# Patient Record
Sex: Female | Born: 1965 | Race: White | Hispanic: No | Marital: Married | Smoking: Never smoker
Health system: Southern US, Community
[De-identification: ages and names within clinical notes are randomized; demographics above are authoritative.]

## PROBLEM LIST (undated history)

## (undated) DIAGNOSIS — R002 Palpitations: Secondary | ICD-10-CM

## (undated) DIAGNOSIS — M6281 Muscle weakness (generalized): Secondary | ICD-10-CM

## (undated) DIAGNOSIS — R251 Tremor, unspecified: Secondary | ICD-10-CM

## (undated) DIAGNOSIS — M545 Low back pain, unspecified: Secondary | ICD-10-CM

## (undated) DIAGNOSIS — R5383 Other fatigue: Secondary | ICD-10-CM

## (undated) DIAGNOSIS — F419 Anxiety disorder, unspecified: Secondary | ICD-10-CM

## (undated) DIAGNOSIS — G8929 Other chronic pain: Secondary | ICD-10-CM

## (undated) DIAGNOSIS — A6920 Lyme disease, unspecified: Secondary | ICD-10-CM

## (undated) DIAGNOSIS — R229 Localized swelling, mass and lump, unspecified: Principal | ICD-10-CM

## (undated) DIAGNOSIS — K219 Gastro-esophageal reflux disease without esophagitis: Secondary | ICD-10-CM

## (undated) HISTORY — DX: Localized swelling, mass and lump, unspecified: R22.9

## (undated) HISTORY — DX: Anxiety disorder, unspecified: F41.9

## (undated) HISTORY — DX: Other fatigue: R53.83

## (undated) HISTORY — DX: Gastro-esophageal reflux disease without esophagitis: K21.9

## (undated) HISTORY — DX: Tremor, unspecified: R25.1

## (undated) HISTORY — DX: Muscle weakness (generalized): M62.81

## (undated) HISTORY — DX: Palpitations: R00.2

## (undated) HISTORY — DX: Other chronic pain: G89.29

## (undated) HISTORY — DX: Low back pain: M54.5

## (undated) HISTORY — DX: Hereditary hemochromatosis: E83.110

## (undated) HISTORY — DX: Low back pain, unspecified: M54.50

---

## 1989-07-20 HISTORY — PX: BREAST ENHANCEMENT SURGERY: SHX7

## 1998-07-20 HISTORY — PX: NASAL SINUS SURGERY: SHX719

## 2006-03-15 ENCOUNTER — Emergency Department (HOSPITAL_COMMUNITY): Admission: EM | Admit: 2006-03-15 | Discharge: 2006-03-15 | Payer: Self-pay | Admitting: Family Medicine

## 2006-06-25 ENCOUNTER — Emergency Department (HOSPITAL_COMMUNITY): Admission: EM | Admit: 2006-06-25 | Discharge: 2006-06-25 | Payer: Self-pay | Admitting: Emergency Medicine

## 2006-06-25 ENCOUNTER — Encounter: Admission: RE | Admit: 2006-06-25 | Discharge: 2006-06-25 | Payer: Self-pay | Admitting: Obstetrics and Gynecology

## 2006-09-24 ENCOUNTER — Ambulatory Visit (HOSPITAL_COMMUNITY): Admission: RE | Admit: 2006-09-24 | Discharge: 2006-09-24 | Payer: Self-pay | Admitting: Obstetrics and Gynecology

## 2006-10-27 ENCOUNTER — Ambulatory Visit (HOSPITAL_COMMUNITY): Admission: RE | Admit: 2006-10-27 | Discharge: 2006-10-27 | Payer: Self-pay | Admitting: Obstetrics and Gynecology

## 2006-10-27 ENCOUNTER — Encounter (INDEPENDENT_AMBULATORY_CARE_PROVIDER_SITE_OTHER): Payer: Self-pay | Admitting: Specialist

## 2007-10-07 ENCOUNTER — Encounter: Admission: RE | Admit: 2007-10-07 | Discharge: 2007-10-07 | Payer: Self-pay | Admitting: Emergency Medicine

## 2009-06-14 ENCOUNTER — Ambulatory Visit: Payer: Self-pay | Admitting: Oncology

## 2009-07-20 HISTORY — PX: CHOLECYSTECTOMY: SHX55

## 2009-07-22 ENCOUNTER — Ambulatory Visit: Payer: Self-pay | Admitting: Oncology

## 2009-07-24 LAB — IRON AND TIBC: Iron: 108 ug/dL (ref 42–145)

## 2009-07-24 LAB — CBC & DIFF AND RETIC
BASO%: 1 % (ref 0.0–2.0)
LYMPH%: 34.4 % (ref 14.0–49.7)
MCHC: 34.3 g/dL (ref 31.5–36.0)
MONO#: 0.4 10*3/uL (ref 0.1–0.9)
Platelets: 197 10*3/uL (ref 145–400)
RBC: 3.93 10*6/uL (ref 3.70–5.45)
Retic %: 0.8 % (ref 0.50–1.50)
WBC: 5 10*3/uL (ref 3.9–10.3)

## 2009-07-24 LAB — FERRITIN: Ferritin: 60 ng/mL (ref 10–291)

## 2009-10-04 ENCOUNTER — Encounter: Admission: RE | Admit: 2009-10-04 | Discharge: 2009-10-04 | Payer: Self-pay | Admitting: Emergency Medicine

## 2009-10-21 ENCOUNTER — Ambulatory Visit (HOSPITAL_COMMUNITY): Admission: RE | Admit: 2009-10-21 | Discharge: 2009-10-21 | Payer: Self-pay | Admitting: Emergency Medicine

## 2009-10-23 ENCOUNTER — Ambulatory Visit (HOSPITAL_COMMUNITY): Admission: RE | Admit: 2009-10-23 | Discharge: 2009-10-23 | Payer: Self-pay | Admitting: Obstetrics and Gynecology

## 2009-11-14 ENCOUNTER — Ambulatory Visit (HOSPITAL_COMMUNITY): Admission: RE | Admit: 2009-11-14 | Discharge: 2009-11-15 | Payer: Self-pay | Admitting: General Surgery

## 2009-11-14 ENCOUNTER — Encounter (INDEPENDENT_AMBULATORY_CARE_PROVIDER_SITE_OTHER): Payer: Self-pay | Admitting: General Surgery

## 2010-02-06 ENCOUNTER — Encounter: Admission: RE | Admit: 2010-02-06 | Discharge: 2010-02-06 | Payer: Self-pay | Admitting: Obstetrics & Gynecology

## 2010-04-22 ENCOUNTER — Encounter: Admission: RE | Admit: 2010-04-22 | Discharge: 2010-04-22 | Payer: Self-pay | Admitting: Emergency Medicine

## 2010-07-16 ENCOUNTER — Ambulatory Visit: Payer: Self-pay | Admitting: Oncology

## 2010-09-09 ENCOUNTER — Other Ambulatory Visit: Payer: Self-pay | Admitting: Emergency Medicine

## 2010-09-09 DIAGNOSIS — R531 Weakness: Secondary | ICD-10-CM

## 2010-09-09 DIAGNOSIS — R200 Anesthesia of skin: Secondary | ICD-10-CM

## 2010-09-16 ENCOUNTER — Ambulatory Visit
Admission: RE | Admit: 2010-09-16 | Discharge: 2010-09-16 | Disposition: A | Discharge status: Home / Self Care | Payer: BC Managed Care – PPO | Source: Ambulatory Visit | Attending: Emergency Medicine | Admitting: Emergency Medicine

## 2010-09-16 DIAGNOSIS — R200 Anesthesia of skin: Secondary | ICD-10-CM

## 2010-09-16 DIAGNOSIS — R531 Weakness: Secondary | ICD-10-CM

## 2010-09-16 MED ORDER — IOHEXOL 300 MG/ML  SOLN
100.0000 mL | Freq: Once | INTRAMUSCULAR | Status: AC | PRN
  Administered 2010-09-16: 100 mL via INTRAVENOUS

## 2010-09-16 MED ORDER — GADOBENATE DIMEGLUMINE 529 MG/ML IV SOLN
9.0000 mL | Freq: Once | INTRAVENOUS | Status: AC | PRN
  Administered 2010-09-16: 9 mL via INTRAVENOUS

## 2010-09-24 ENCOUNTER — Emergency Department (HOSPITAL_COMMUNITY)
Admission: EM | Admit: 2010-09-24 | Discharge: 2010-09-25 | Disposition: A | Discharge status: Home / Self Care | Payer: BC Managed Care – PPO | Attending: Emergency Medicine | Admitting: Emergency Medicine

## 2010-09-24 DIAGNOSIS — Z79899 Other long term (current) drug therapy: Secondary | ICD-10-CM | POA: Insufficient documentation

## 2010-09-24 DIAGNOSIS — F988 Other specified behavioral and emotional disorders with onset usually occurring in childhood and adolescence: Secondary | ICD-10-CM | POA: Insufficient documentation

## 2010-09-24 DIAGNOSIS — J45909 Unspecified asthma, uncomplicated: Secondary | ICD-10-CM | POA: Insufficient documentation

## 2010-09-24 DIAGNOSIS — R209 Unspecified disturbances of skin sensation: Secondary | ICD-10-CM | POA: Insufficient documentation

## 2010-09-24 DIAGNOSIS — R259 Unspecified abnormal involuntary movements: Secondary | ICD-10-CM | POA: Insufficient documentation

## 2010-09-24 DIAGNOSIS — R5381 Other malaise: Secondary | ICD-10-CM | POA: Insufficient documentation

## 2010-09-24 DIAGNOSIS — F411 Generalized anxiety disorder: Secondary | ICD-10-CM | POA: Insufficient documentation

## 2010-09-24 LAB — BASIC METABOLIC PANEL
BUN: 9 mg/dL (ref 6–23)
CO2: 27 mEq/L (ref 19–32)
Chloride: 103 mEq/L (ref 96–112)
Glucose, Bld: 140 mg/dL — ABNORMAL HIGH (ref 70–99)
Potassium: 3.5 mEq/L (ref 3.5–5.1)

## 2010-09-24 LAB — DIFFERENTIAL
Eosinophils Relative: 0 % (ref 0–5)
Lymphocytes Relative: 23 % (ref 12–46)
Lymphs Abs: 1.5 10*3/uL (ref 0.7–4.0)
Neutrophils Relative %: 72 % (ref 43–77)

## 2010-09-24 LAB — CBC
HCT: 37.1 % (ref 36.0–46.0)
Hemoglobin: 13.1 g/dL (ref 12.0–15.0)
MCV: 91.8 fL (ref 78.0–100.0)
RBC: 4.04 MIL/uL (ref 3.87–5.11)
WBC: 6.6 10*3/uL (ref 4.0–10.5)

## 2010-09-25 ENCOUNTER — Emergency Department (HOSPITAL_COMMUNITY): Payer: BC Managed Care – PPO

## 2010-09-25 LAB — URINALYSIS, ROUTINE W REFLEX MICROSCOPIC
Bilirubin Urine: NEGATIVE
Glucose, UA: NEGATIVE mg/dL
Ketones, ur: 15 mg/dL — AB
pH: 6.5 (ref 5.0–8.0)

## 2010-09-29 ENCOUNTER — Other Ambulatory Visit: Payer: Self-pay | Admitting: Neurology

## 2010-09-29 DIAGNOSIS — R209 Unspecified disturbances of skin sensation: Secondary | ICD-10-CM

## 2010-09-29 DIAGNOSIS — IMO0002 Reserved for concepts with insufficient information to code with codable children: Secondary | ICD-10-CM

## 2010-09-29 DIAGNOSIS — M79609 Pain in unspecified limb: Secondary | ICD-10-CM

## 2010-09-29 DIAGNOSIS — R9089 Other abnormal findings on diagnostic imaging of central nervous system: Secondary | ICD-10-CM

## 2010-10-01 ENCOUNTER — Ambulatory Visit
Admission: RE | Admit: 2010-10-01 | Discharge: 2010-10-01 | Disposition: A | Discharge status: Home / Self Care | Payer: BC Managed Care – PPO | Source: Ambulatory Visit | Attending: Neurology | Admitting: Neurology

## 2010-10-01 DIAGNOSIS — M79609 Pain in unspecified limb: Secondary | ICD-10-CM

## 2010-10-01 DIAGNOSIS — IMO0002 Reserved for concepts with insufficient information to code with codable children: Secondary | ICD-10-CM

## 2010-10-01 DIAGNOSIS — R9089 Other abnormal findings on diagnostic imaging of central nervous system: Secondary | ICD-10-CM

## 2010-10-01 DIAGNOSIS — R209 Unspecified disturbances of skin sensation: Secondary | ICD-10-CM

## 2010-10-07 LAB — URINALYSIS, ROUTINE W REFLEX MICROSCOPIC
Nitrite: NEGATIVE
Specific Gravity, Urine: 1.027 (ref 1.005–1.030)
Urobilinogen, UA: 0.2 mg/dL (ref 0.0–1.0)
pH: 5.5 (ref 5.0–8.0)

## 2010-10-07 LAB — PREGNANCY, URINE: Preg Test, Ur: NEGATIVE

## 2010-10-20 ENCOUNTER — Other Ambulatory Visit: Payer: Self-pay | Admitting: Oncology

## 2010-10-20 ENCOUNTER — Encounter (HOSPITAL_BASED_OUTPATIENT_CLINIC_OR_DEPARTMENT_OTHER): Payer: BC Managed Care – PPO | Admitting: Oncology

## 2010-10-20 LAB — CBC WITH DIFFERENTIAL/PLATELET
Basophils Absolute: 0 10*3/uL (ref 0.0–0.1)
Eosinophils Absolute: 0.3 10*3/uL (ref 0.0–0.5)
HGB: 12.7 g/dL (ref 11.6–15.9)
MONO%: 5 % (ref 0.0–14.0)
NEUT#: 3.5 10*3/uL (ref 1.5–6.5)
RBC: 3.84 10*6/uL (ref 3.70–5.45)
RDW: 12.3 % (ref 11.2–14.5)
WBC: 5.5 10*3/uL (ref 3.9–10.3)
lymph#: 1.5 10*3/uL (ref 0.9–3.3)

## 2010-10-20 LAB — COMPREHENSIVE METABOLIC PANEL
AST: 17 U/L (ref 0–37)
Albumin: 4 g/dL (ref 3.5–5.2)
Alkaline Phosphatase: 43 U/L (ref 39–117)
BUN: 10 mg/dL (ref 6–23)
Calcium: 9.3 mg/dL (ref 8.4–10.5)
Chloride: 103 mEq/L (ref 96–112)
Glucose, Bld: 123 mg/dL — ABNORMAL HIGH (ref 70–99)
Potassium: 3 mEq/L — ABNORMAL LOW (ref 3.5–5.3)
Sodium: 140 mEq/L (ref 135–145)
Total Protein: 6.6 g/dL (ref 6.0–8.3)

## 2010-10-22 ENCOUNTER — Other Ambulatory Visit: Payer: Self-pay | Admitting: Oncology

## 2010-10-22 ENCOUNTER — Encounter: Payer: BC Managed Care – PPO | Admitting: Oncology

## 2010-10-22 LAB — PROTHROMBIN TIME: Prothrombin Time: 13.2 seconds (ref 11.6–15.2)

## 2010-10-23 LAB — RHEUMATOID FACTOR: Rhuematoid fact SerPl-aCnc: 10 IU/mL (ref <=14)

## 2010-10-24 LAB — CARDIOLIPIN ANTIBODIES, IGG, IGM, IGA
Anticardiolipin IgA: 5 APL U/mL (ref <22)
Anticardiolipin IgG: 3 GPL U/mL (ref <23)
Anticardiolipin IgM: 14 MPL U/mL — ABNORMAL HIGH (ref <11)

## 2010-10-24 LAB — BETA-2 GLYCOPROTEIN ANTIBODIES: Beta-2 Glyco I IgG: 0 G Units (ref <20)

## 2010-10-24 LAB — IMMUNOFIXATION ELECTROPHORESIS
IgA: 166 mg/dL (ref 68–378)
IgM, Serum: 108 mg/dL (ref 60–263)
Total Protein, Serum Electrophoresis: 6.4 g/dL (ref 6.0–8.3)

## 2010-10-24 LAB — LUPUS ANTICOAGULANT PANEL: Lupus Anticoagulant: NOT DETECTED

## 2010-10-28 DIAGNOSIS — M545 Low back pain, unspecified: Secondary | ICD-10-CM | POA: Insufficient documentation

## 2010-10-28 DIAGNOSIS — M6281 Muscle weakness (generalized): Secondary | ICD-10-CM | POA: Insufficient documentation

## 2010-10-28 DIAGNOSIS — F419 Anxiety disorder, unspecified: Secondary | ICD-10-CM

## 2010-10-28 DIAGNOSIS — F329 Major depressive disorder, single episode, unspecified: Secondary | ICD-10-CM | POA: Insufficient documentation

## 2010-10-28 DIAGNOSIS — R202 Paresthesia of skin: Secondary | ICD-10-CM | POA: Insufficient documentation

## 2010-10-28 DIAGNOSIS — R1084 Generalized abdominal pain: Secondary | ICD-10-CM | POA: Insufficient documentation

## 2010-10-28 DIAGNOSIS — N83209 Unspecified ovarian cyst, unspecified side: Secondary | ICD-10-CM | POA: Insufficient documentation

## 2010-10-28 DIAGNOSIS — J45909 Unspecified asthma, uncomplicated: Secondary | ICD-10-CM | POA: Insufficient documentation

## 2010-10-28 DIAGNOSIS — G8929 Other chronic pain: Secondary | ICD-10-CM

## 2010-10-28 DIAGNOSIS — R5383 Other fatigue: Secondary | ICD-10-CM | POA: Insufficient documentation

## 2010-10-28 DIAGNOSIS — R509 Fever, unspecified: Secondary | ICD-10-CM

## 2010-10-28 DIAGNOSIS — F909 Attention-deficit hyperactivity disorder, unspecified type: Secondary | ICD-10-CM | POA: Insufficient documentation

## 2010-10-28 DIAGNOSIS — K219 Gastro-esophageal reflux disease without esophagitis: Secondary | ICD-10-CM

## 2010-10-28 DIAGNOSIS — N390 Urinary tract infection, site not specified: Secondary | ICD-10-CM | POA: Insufficient documentation

## 2010-10-31 ENCOUNTER — Ambulatory Visit: Payer: BC Managed Care – PPO

## 2010-10-31 ENCOUNTER — Ambulatory Visit (INDEPENDENT_AMBULATORY_CARE_PROVIDER_SITE_OTHER): Payer: BC Managed Care – PPO | Admitting: Internal Medicine

## 2010-10-31 ENCOUNTER — Encounter: Payer: Self-pay | Admitting: Internal Medicine

## 2010-10-31 VITALS — BP 117/75 | HR 101 | Temp 97.9°F | Ht 62.0 in | Wt 107.5 lb

## 2010-10-31 DIAGNOSIS — Z9049 Acquired absence of other specified parts of digestive tract: Secondary | ICD-10-CM

## 2010-10-31 DIAGNOSIS — Z978 Presence of other specified devices: Secondary | ICD-10-CM

## 2010-10-31 DIAGNOSIS — Z98891 History of uterine scar from previous surgery: Secondary | ICD-10-CM | POA: Insufficient documentation

## 2010-10-31 DIAGNOSIS — Z9889 Other specified postprocedural states: Secondary | ICD-10-CM

## 2010-10-31 DIAGNOSIS — Z9882 Breast implant status: Secondary | ICD-10-CM | POA: Insufficient documentation

## 2010-10-31 DIAGNOSIS — R209 Unspecified disturbances of skin sensation: Secondary | ICD-10-CM

## 2010-10-31 DIAGNOSIS — R509 Fever, unspecified: Secondary | ICD-10-CM

## 2010-10-31 DIAGNOSIS — R202 Paresthesia of skin: Secondary | ICD-10-CM

## 2010-10-31 NOTE — Assessment & Plan Note (Addendum)
I am not at all clear what has caused her recent fevers and I am not certain that they are part of her overall progressive neurologic illness or simply some other process layered on top. I discussed the situation with Dr. Sandria Manly. Her LMP was entirely normal with the exception of a minimally elevated protein of 46. I do not think that that is of any clinical significance and do not believe that she has infectious meningoencephalitis. I do not see any evidence of active infection at this time and would not do further diagnostic testing for infection or give any empiric antimicrobial therapy. I would certainly be happy to reevaluate her in the future if she starts to have any temperatures greater than 101 or other signs or symptoms of infection.

## 2010-10-31 NOTE — Progress Notes (Signed)
Subjective:    Patient ID: Chelsea Alexander, female    DOB: 1966/03/26, 45 y.o.   MRN: [id number]  HPI Mrs. Chelsea Alexander is a 3 year old working mother of 3 who is referred to me by Dr. Leslee Home for evaluation of fever. She states that her illness began in 2004 when she was pregnant with her youngest child. Her health prior that time he been fairly good with the exception of the asthma, allergies and attention deficit disorder. During that pregnancy she gained 80 pounds and developed preeclampsia. She underwent an epidural block and an emergency C-section. After that, her blood pressure normalized and she was able to lose the weight gradually but she was left with a dull aching pain in her right calf. She saw a neurologist and underwent nerve conduction studies which he says were inconclusive. Over the next few years she developed migraine headaches and progressive numbness in both legs and feet. She thought that this might be due to some sort of back problem and tried to tolerate it. She states that an MRI of her spine was inconclusive. However, over the last 6-12 months the numbness has spread to her upper body including her arms shoulders and face and she is developed right arm weakness. When she wakes in the morning she states that she is a little tired but feels better after a cup of coffee. The numbness and weakness are not as noticeable in the morning but get progressively worse through the day. By evening she is barely able to use her right hand and is extremely fatigued. She says that her ability to concentrate deteriorates through the day and by the evening she is unable to help her children at home work.  In the last few years she is also been diagnosed with hemochromatosis for which he has a strong family history. She is followed by Dr. Riley Churches and has been told that her symptoms are probably not related to the hemochromatosis. She says that she has felt depressed and frustrated over the last  year and feels that is secondary to her illness and not causing it. She tried taking some Zoloft recently but said that that made her feel more irritable and she stopped it.  About 3 months ago she began to notice some low-grade fevers particularly in the late afternoon and evening when she was particularly tired. On most days it would recheck 100 but occasionally up to 101. She's also had some periods of dysuria and left flank pain. Her records indicate that she's had some intermittent pyuria with negative blood cultures. About a month ago she received a course of Cipro but felt like this aggravated her neurologic symptoms. She was seen in the emergency room where a CT of the brain was unremarkable. She stopped taking the Cipro and her neurologic symptoms return to baseline. About 2 weeks ago the fever seemed to go away and that part of her illness seems to be better.  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 She underwent a lumbar puncture last month but I do not have the results of that yet. She tells me that Dr. Sandria Manly could not make a diagnosis of multiple sclerosis or any other specific neurologic condition.  She recalls having an attached tick over 20 years ago on the back of her neck while living in [place]. Lyme serologies have been negative. She also was told that she might have been exposed to parvovirus recently to her  children and parvovirus IgM was negative. Her IgG was positive indicative of remote infection. Her CBCs have been normal. Complete metabolic panel was normal except for a slightly elevated glucose. Her C. reactive protein was low normal at 0.1. An ANA was negative. I am told that her RA was positive but I do not have those results in hand. Her TSH level was normal. A CT of her abdomen and pelvis was normal as well.    Review of Systems  Constitutional: Positive for fever, activity change, appetite change and fatigue. Negative for  chills, diaphoresis and unexpected weight change.  HENT: Negative for congestion, sore throat, rhinorrhea, sneezing, mouth sores, neck pain and neck stiffness.   Eyes: Positive for photophobia and visual disturbance. Negative for pain and redness.  Respiratory: Positive for chest tightness. Negative for cough, shortness of breath and wheezing.   Cardiovascular: Negative for chest pain and leg swelling.  Gastrointestinal: Positive for nausea and constipation. Negative for vomiting, abdominal pain and diarrhea.  Genitourinary: Positive for dysuria and flank pain. Negative for frequency, difficulty urinating, genital sores and dyspareunia.  Musculoskeletal: Negative for myalgias, back pain, joint swelling, arthralgias and gait problem.  Skin: Positive for color change and rash. Negative for wound.  Neurological: Positive for tremors, weakness, numbness and headaches. Negative for seizures, facial asymmetry, speech difficulty and light-headedness.  Hematological: Negative for adenopathy. Bruises/bleeds easily.  Psychiatric/Behavioral: Positive for sleep disturbance, dysphoric mood and decreased concentration. Negative for suicidal ideas.       Objective:   Physical Exam  Constitutional: She is oriented to person, place, and time. She appears well-developed and well-nourished. No distress.  HENT:  Mouth/Throat: Oropharynx is clear and moist. No oropharyngeal exudate.  Eyes: Conjunctivae and EOM are normal. Pupils are equal, round, and reactive to light.  Neck: Normal range of motion. Neck supple. No thyromegaly present.  Cardiovascular: Normal rate and regular rhythm.  Exam reveals no friction rub.   No murmur heard. Pulmonary/Chest: Breath sounds normal. She has no wheezes. She has no rales.  Abdominal: Soft. Bowel sounds are normal. She exhibits no distension and no mass. There is no tenderness.  Musculoskeletal: Normal range of motion. She exhibits no edema and no tenderness.    Lymphadenopathy:    She has no cervical adenopathy.  Neurological: She is oriented to person, place, and time.  Skin: Skin is warm. Rash noted. There is erythema.       Her palms and feet are reddened compared to the rest of her skin. There is a fine lacy red rash on her right forearm.  Psychiatric: She has a normal mood and affect.       She was tearful when talking about the progressive nature of her neurologic symptoms and said she was afraid that she might not be able to take care of her family if this continued to get worse.          Assessment & Plan:

## 2010-11-11 ENCOUNTER — Ambulatory Visit: Payer: BC Managed Care – PPO | Admitting: Internal Medicine

## 2010-12-05 NOTE — Op Note (Signed)
NAMEDANAIJA, Chelsea Alexander NO.:  [id number]   MEDICAL RECORD NO.:  [id number]          PATIENT TYPE:  AMB   LOCATION:  SDC                           FACILITY:  [facility]   PHYSICIAN:  Zelphia Cairo, MD    DATE OF BIRTH:  1965-12-16   DATE OF PROCEDURE:  10/27/2006  DATE OF DISCHARGE:                               OPERATIVE REPORT   PREOPERATIVE DIAGNOSIS:  Missed abortion.   POSTOPERATIVE DIAGNOSIS:  Missed abortion.   PROCEDURE:  Suction dilatation and evacuation.   SURGEON:  Zelphia Cairo, M.D.   ASSISTANT:  None.   ANESTHESIA:  MAC with local.   SPECIMENS:  Products of conception.   ESTIMATED BLOOD LOSS:  Minimal.   COMPLICATIONS:  None.   CONDITION:  Stable and extubated to the recovery room.   PROCEDURE:  The patient was taken to the operating room, where  anesthesia was obtained.  She was placed in the dorsal lithotomy  position using Allen stirrups.  She was prepped and draped in a sterile  fashion, and a catheter was used to drain her bladder for approximately  25 cc of clear urine.  A bivalve speculum was placed in the vagina, and  a single-tooth tenaculum was placed on the anterior lip of the cervix.  The cervix was easily dilated using serial Pratt dilators.  An 8 French  suction catheter was then inserted into the uterine cavity, and products  of conception were removed.  A Kevorkian curette was then used to insure  uterine cry, and then all tissue was removed from the uterus.  The  suction catheter was then reinserted to remove any clots and debris.  Then 4 cc of 1% Nesacaine was injected to provide local anesthesia.  A  single-tooth tenaculum and speculum were then removed.  Patient was  taken to the recovery room in stable condition.  Sponge, lap, needle,  and instrument counts were correct x2.      Zelphia Cairo, MD  Electronically Signed     GA/MEDQ  D:  10/27/2006  T:  10/27/2006  Job:  [id number]

## 2010-12-10 ENCOUNTER — Ambulatory Visit (HOSPITAL_COMMUNITY)
Admission: RE | Admit: 2010-12-10 | Discharge: 2010-12-10 | Disposition: A | Discharge status: Home / Self Care | Payer: BC Managed Care – PPO | Source: Ambulatory Visit | Attending: Emergency Medicine | Admitting: Emergency Medicine

## 2010-12-10 ENCOUNTER — Ambulatory Visit: Payer: BC Managed Care – PPO | Admitting: Cardiovascular Disease

## 2010-12-10 DIAGNOSIS — R259 Unspecified abnormal involuntary movements: Secondary | ICD-10-CM | POA: Insufficient documentation

## 2010-12-10 DIAGNOSIS — Z1389 Encounter for screening for other disorder: Secondary | ICD-10-CM | POA: Insufficient documentation

## 2010-12-11 NOTE — Procedures (Signed)
REFERRING PHYSICIAN:  Reuben Likes, MD  HISTORY:  A 45 year old female with episodes of a body shaking evaluated to rule out seizure.  MEDICATIONS:  Neurontin and Adderall.  CONDITIONS OF RECORDING:  This is a 16-channel EEG carried out with the patient in the awake state.  DESCRIPTION:  The waking background activity consists of a low-voltage symmetrical fairly well-organized 10 Hz alpha activity seen from the parieto-occipital and posterotemporal regions.  Low-voltage fast activity poorly organized was seen anteriorly at times superimposed on more posterior rhythms.  A mixture of theta and alpha rhythm was seen from the central and temporal regions.  The patient does not drowse or sleep.  Hypoventilation and intermittent photic stimulation were both performed, but failed to elicit any change in the tracing.  The patient did have episodes of jerking during the tracing, but no EEG correlate was noted.  IMPRESSION:  This is a normal EEG.  The patient did have episodes of jerking during the tracing without any EEG correlate noted.  COMMENT:  An EEG with the patient sleep deprived to elicit drowse and light sleep may be desirable to further elicit a possible seizure disorder.          ______________________________ Thana Farr, MD    ZO:XWRU D:  12/10/2010 17:11:52  T:  12/11/2010 01:38:25  Job #:  [id number]

## 2010-12-25 ENCOUNTER — Encounter: Payer: Self-pay | Admitting: Internal Medicine

## 2010-12-25 ENCOUNTER — Encounter: Payer: Self-pay | Admitting: *Deleted

## 2010-12-26 ENCOUNTER — Encounter: Payer: Self-pay | Admitting: Internal Medicine

## 2010-12-26 ENCOUNTER — Encounter: Payer: Self-pay | Admitting: *Deleted

## 2010-12-26 ENCOUNTER — Ambulatory Visit (INDEPENDENT_AMBULATORY_CARE_PROVIDER_SITE_OTHER): Payer: BC Managed Care – PPO | Admitting: Internal Medicine

## 2010-12-26 DIAGNOSIS — R002 Palpitations: Secondary | ICD-10-CM

## 2010-12-26 DIAGNOSIS — R0602 Shortness of breath: Secondary | ICD-10-CM

## 2010-12-26 LAB — BASIC METABOLIC PANEL
BUN: 12 mg/dL (ref 6–23)
CO2: 30 mEq/L (ref 19–32)
Calcium: 9.1 mg/dL (ref 8.4–10.5)
Creatinine, Ser: 0.7 mg/dL (ref 0.4–1.2)
GFR: 89.51 mL/min (ref >60.00)
Glucose, Bld: 81 mg/dL (ref 70–99)

## 2010-12-26 NOTE — Patient Instructions (Signed)
Your physician has requested that you have an echocardiogram. Echocardiography is a painless test that uses sound waves to create images of your heart. It provides your doctor with information about the size and shape of your heart and how well your heart's chambers and valves are working. This procedure takes approximately one hour. There are no restrictions for this procedure.  Your physician has recommended that you wear a 48 hour holter monitor. Holter monitors are medical devices that record the heart's electrical activity. Doctors most often use these monitors to diagnose arrhythmias. Arrhythmias are problems with the speed or rhythm of the heartbeat. The monitor is a small, portable device. You can wear one while you do your normal daily activities. This is usually used to diagnose what is causing palpitations/syncope (passing out).  Your physician recommends that you have lab work today: bmp/d-dimer (785.1;786.05)  We will see you back as needed pending the results of your tests.

## 2010-12-29 ENCOUNTER — Ambulatory Visit (HOSPITAL_COMMUNITY): Payer: BC Managed Care – PPO | Attending: Internal Medicine | Admitting: Radiology

## 2010-12-29 ENCOUNTER — Encounter (INDEPENDENT_AMBULATORY_CARE_PROVIDER_SITE_OTHER): Payer: BC Managed Care – PPO

## 2010-12-29 DIAGNOSIS — R002 Palpitations: Secondary | ICD-10-CM

## 2010-12-29 DIAGNOSIS — I059 Rheumatic mitral valve disease, unspecified: Secondary | ICD-10-CM | POA: Insufficient documentation

## 2010-12-29 DIAGNOSIS — R0602 Shortness of breath: Secondary | ICD-10-CM

## 2010-12-29 DIAGNOSIS — R011 Cardiac murmur, unspecified: Secondary | ICD-10-CM

## 2010-12-29 DIAGNOSIS — R0989 Other specified symptoms and signs involving the circulatory and respiratory systems: Secondary | ICD-10-CM | POA: Insufficient documentation

## 2010-12-29 DIAGNOSIS — R0609 Other forms of dyspnea: Secondary | ICD-10-CM | POA: Insufficient documentation

## 2010-12-30 ENCOUNTER — Telehealth: Payer: Self-pay

## 2010-12-30 NOTE — Telephone Encounter (Addendum)
Pt Signed ROI,records copied she will pick-up 12/31/10 12/30/10/km.Marland KitchenMarland KitchenPt picked up records 12/31/10   Pt Signed ROI, All Records were Faxed to  1.[facility] [phone] 2.[facility] [phone] 01/05/11/km

## 2011-01-02 ENCOUNTER — Telehealth: Payer: Self-pay | Admitting: Internal Medicine

## 2011-01-02 ENCOUNTER — Encounter: Payer: Self-pay | Admitting: Internal Medicine

## 2011-01-02 NOTE — Telephone Encounter (Signed)
Pt calling re test results from last week

## 2011-01-02 NOTE — Telephone Encounter (Signed)
Chelsea Alexander attempted to call the patient with her echo results. She was given her lab results by Chelsea Alexander when she was in the office for her echo. The patient's holter results were faxed to the [facility] clinic. I need to fax all of her results to her at [phone] per her request.

## 2011-01-05 NOTE — Telephone Encounter (Signed)
Records faxed per KIM in Health Information Management

## 2011-01-05 NOTE — Telephone Encounter (Signed)
Need to fax all of her results to [facility] clinic [phone].

## 2011-01-07 ENCOUNTER — Telehealth: Payer: Self-pay | Admitting: Internal Medicine

## 2011-01-07 NOTE — Telephone Encounter (Signed)
ECHO RESULTS GIVEN TO PT'S MOTHER./CY

## 2011-01-07 NOTE — Telephone Encounter (Signed)
Pt having testing done and per mother pt has signed a hippa form in order to get information for her daughter

## 2011-01-23 ENCOUNTER — Telehealth: Payer: Self-pay | Admitting: Internal Medicine

## 2011-01-23 NOTE — Telephone Encounter (Signed)
Pt wants to know holter results.

## 2011-01-23 NOTE — Telephone Encounter (Signed)
I spoke with the patient about all of her results again (echo, holter, & labs). She states the [facility] clinic did not really discuss these with her in any detail.

## 2011-02-11 ENCOUNTER — Encounter: Payer: Self-pay | Admitting: Internal Medicine

## 2011-02-11 NOTE — Progress Notes (Signed)
Extensive history was obtained. The patient is fully examined. The patient had a multitude of complaints and I did not have a good explanation for them. I recommended that she consider self-referral for tertiary care evaluation. We discussed the [facility]. At the time of this late dictation, she has already been to the [facility] and their evaluation is ongoing.

## 2011-03-24 ENCOUNTER — Encounter (HOSPITAL_BASED_OUTPATIENT_CLINIC_OR_DEPARTMENT_OTHER): Payer: BC Managed Care – PPO | Admitting: Oncology

## 2011-03-24 ENCOUNTER — Other Ambulatory Visit: Payer: Self-pay | Admitting: Oncology

## 2011-03-24 LAB — CBC WITH DIFFERENTIAL/PLATELET
EOS%: 12.1 % — ABNORMAL HIGH (ref 0.0–7.0)
Eosinophils Absolute: 0.7 10*3/uL — ABNORMAL HIGH (ref 0.0–0.5)
LYMPH%: 37.8 % (ref 14.0–49.7)
MCH: 33 pg (ref 25.1–34.0)
MCHC: 34.3 g/dL (ref 31.5–36.0)
MCV: 96.3 fL (ref 79.5–101.0)
MONO%: 7.3 % (ref 0.0–14.0)
NEUT#: 2.3 10*3/uL (ref 1.5–6.5)
Platelets: 195 10*3/uL (ref 145–400)
RBC: 3.79 10*6/uL (ref 3.70–5.45)

## 2011-03-24 LAB — FERRITIN: Ferritin: 33 ng/mL (ref 10–291)

## 2011-03-26 ENCOUNTER — Other Ambulatory Visit: Payer: Self-pay | Admitting: Emergency Medicine

## 2011-03-26 DIAGNOSIS — G2581 Restless legs syndrome: Secondary | ICD-10-CM

## 2011-03-27 ENCOUNTER — Ambulatory Visit
Admission: RE | Admit: 2011-03-27 | Discharge: 2011-03-27 | Disposition: A | Discharge status: Home / Self Care | Payer: BC Managed Care – PPO | Source: Ambulatory Visit | Attending: Emergency Medicine | Admitting: Emergency Medicine

## 2011-03-27 DIAGNOSIS — G2581 Restless legs syndrome: Secondary | ICD-10-CM

## 2011-03-31 ENCOUNTER — Encounter (HOSPITAL_BASED_OUTPATIENT_CLINIC_OR_DEPARTMENT_OTHER): Payer: BC Managed Care – PPO | Admitting: Oncology

## 2011-03-31 DIAGNOSIS — G9009 Other idiopathic peripheral autonomic neuropathy: Secondary | ICD-10-CM

## 2011-04-09 ENCOUNTER — Encounter: Payer: Self-pay | Admitting: Internal Medicine

## 2011-04-21 ENCOUNTER — Encounter: Payer: Self-pay | Admitting: Internal Medicine

## 2011-04-21 ENCOUNTER — Encounter (INDEPENDENT_AMBULATORY_CARE_PROVIDER_SITE_OTHER): Payer: BC Managed Care – PPO | Admitting: Internal Medicine

## 2011-04-21 DIAGNOSIS — R0989 Other specified symptoms and signs involving the circulatory and respiratory systems: Secondary | ICD-10-CM

## 2011-04-21 NOTE — Progress Notes (Signed)
Pt not seen  Her CT will be reivewed as it describes Coronary artery calcifications

## 2011-04-24 ENCOUNTER — Telehealth: Payer: Self-pay | Admitting: Internal Medicine

## 2011-04-24 DIAGNOSIS — R9389 Abnormal findings on diagnostic imaging of other specified body structures: Secondary | ICD-10-CM

## 2011-04-24 DIAGNOSIS — R5383 Other fatigue: Secondary | ICD-10-CM

## 2011-04-24 NOTE — Telephone Encounter (Signed)
Patient calling drop off scan of chest, was told by Dr,Klein that someone would look at it. Patient just following up on it.

## 2011-04-24 NOTE — Telephone Encounter (Signed)
Patient called to find out if Dr. Graciela Husbands has review with Dr. Eden Emms or another MD the scan of chest she drop off a week or two ago. She would like to know soon.

## 2011-04-28 NOTE — Telephone Encounter (Signed)
Dr. Graciela Husbands has left a message for Dr. Shirlee Latch to see if he has reviewed the patient's CT.

## 2011-04-29 NOTE — Telephone Encounter (Signed)
PT AWARE DR [facility] TO REVIEW TODAY  ANN LANKFORD RN WILL CALL PT TOM  WITH RESULTS  .HERE IS PT'S CELL NUM [phone] WILL NOT BE ABLE TO REACH PT AT HOME TOM./CY

## 2011-04-29 NOTE — Telephone Encounter (Signed)
Pt calling wanting to know if someone has viewed pt CT scan. Please return pt call to discuss further.

## 2011-04-30 ENCOUNTER — Encounter (HOSPITAL_BASED_OUTPATIENT_CLINIC_OR_DEPARTMENT_OTHER): Payer: BC Managed Care – PPO | Admitting: Oncology

## 2011-04-30 ENCOUNTER — Other Ambulatory Visit: Payer: Self-pay | Admitting: Oncology

## 2011-04-30 NOTE — Telephone Encounter (Signed)
I spoke with Dr. DM. He knows that the CT scan demonstrated LAD calcification. After discussion, it was his recommendation that we undertake Myoview scanning to look for functional significance of this lesion.

## 2011-04-30 NOTE — Telephone Encounter (Signed)
Dr Shirlee Latch reviewed CT today and discussed with Dr Graciela Husbands.

## 2011-04-30 NOTE — Telephone Encounter (Signed)
I spoke with the patient is and she is aware of Dr. Shirlee Latch & Dr. Odessa Fleming recommendations for nuclear testing. She is willing to proceed with this. I will order this for her and ask the PCC's to call her tomorrow.

## 2011-05-04 ENCOUNTER — Ambulatory Visit (HOSPITAL_COMMUNITY): Payer: BC Managed Care – PPO | Attending: Internal Medicine | Admitting: Radiology

## 2011-05-04 DIAGNOSIS — R0789 Other chest pain: Secondary | ICD-10-CM

## 2011-05-04 DIAGNOSIS — R9389 Abnormal findings on diagnostic imaging of other specified body structures: Secondary | ICD-10-CM | POA: Insufficient documentation

## 2011-05-04 DIAGNOSIS — R5381 Other malaise: Secondary | ICD-10-CM | POA: Insufficient documentation

## 2011-05-04 DIAGNOSIS — R0609 Other forms of dyspnea: Secondary | ICD-10-CM

## 2011-05-04 DIAGNOSIS — R5383 Other fatigue: Secondary | ICD-10-CM

## 2011-05-04 MED ORDER — TECHNETIUM TC 99M TETROFOSMIN IV KIT
11.0000 | PACK | Freq: Once | INTRAVENOUS | Status: AC | PRN
  Administered 2011-05-04: 11 via INTRAVENOUS

## 2011-05-04 MED ORDER — TECHNETIUM TC 99M TETROFOSMIN IV KIT
33.0000 | PACK | Freq: Once | INTRAVENOUS | Status: AC | PRN
  Administered 2011-05-04: 33 via INTRAVENOUS

## 2011-05-04 NOTE — Progress Notes (Signed)
[facility] SITE [address] NUCLEAR MED [address] [place] [address] [phone]  Cardiology Nuclear Med Study  Chelsea Alexander is a 45 y.o. female [id number] [birth year]   Nuclear Med Background Indication for Stress Test:  Evaluation for Ischemia with Abnormal CT History:  No previous documented CAD and 6/12 Holter Monitor:Nonsustained Atrial Tachycardia; 6/12 Echo:EF=55-60%; 8/12 CT Scan:LAD  Calcification Cardiac Risk Factors: None  Symptoms:  Chest Pressure.  (last episode of chest discomfort was last night), DOE, Fatigue, Nausea, Palpitations and Rapid HR   Nuclear Pre-Procedure Caffeine/Decaff Intake:  None NPO After: 8:00pm   Lungs:  Clear  IV 0.9% NS with Angio Cath:  22g  IV Site: R Hand  IV Started by:  Cathlyn Parsons, RN  Chest Size (in):  36 Cup Size: C  Height: 5\' 2"  (1.575 m)  Weight:  125 lb (56.7 kg)  BMI:  Body mass index is 22.86 kg/(m^2). Tech Comments:  n/a    Nuclear Med Study 1 or 2 day study: 1 day  Stress Test Type:  Stress  Reading MD: Olga Millers, MD  Order Authorizing Provider:  Sherryl Manges, MD  Resting Radionuclide: Technetium 48m Tetrofosmin  Resting Radionuclide Dose: 11.0 mCi   Stress Radionuclide:  Technetium 49m Tetrofosmin  Stress Radionuclide Dose: 33.0 mCi           Stress Protocol Rest HR: 95 Stress HR: 160  Rest BP: 126/85 Stress BP: 123  Exercise Time (min): 9:00 METS: 10.1   Predicted Max HR: 173 bpm % Max HR: 92.49 bpm Rate Pressure Product: 13244   Dose of Adenosine (mg):  n/a Dose of Lexiscan: n/a mg  Dose of Atropine (mg): n/a Dose of Dobutamine: n/a mcg/kg/min (at max HR)  Stress Test Technologist: Smiley Houseman, CMA-N  Nuclear Technologist:  Domenic Polite, CNMT     Rest Procedure:  Myocardial perfusion imaging was performed at rest 45 minutes following the intravenous administration of Technetium 32m Tetrofosmin.  Rest ECG: No acute changes.  Rare PVC.  Stress Procedure:  The patient exercised  for nine minutes on the treadmill utilizing the Bruce protocol.  The patient stopped due to fatigue.  She did c/o chest pressure, 5/10, with exercise.  There were no diagnostic ST-T wave changes, only rare PVC's and a blunted BP response.  Technetium 106m Tetrofosmin was injected at peak exercise and myocardial perfusion imaging was performed after a brief delay.  Stress ECG: No significant ST segment change suggestive of ischemia.  QPS Raw Data Images:  Acquisition technically good; normal left ventricular size. Stress Images:  Normal homogeneous uptake in all areas of the myocardium. Rest Images:  Normal homogeneous uptake in all areas of the myocardium. Subtraction (SDS):  No evidence of ischemia. Transient Ischemic Dilatation (Normal <1.22):  1.00 Lung/Heart Ratio (Normal <0.45):  0.15  Quantitative Gated Spect Images QGS EDV:  76 ml QGS ESV:  30 ml QGS cine images:  NL LV Function; NL Wall Motion QGS EF: 60%  Impression Exercise Capacity:  Good exercise capacity. BP Response:  Normal blood pressure response. Clinical Symptoms:  There is chest pain. ECG Impression:  No significant ST segment change suggestive of ischemia. Comparison with Prior Nuclear Study: No previous nuclear study performed  Overall Impression:  Normal stress nuclear study.   Olga Millers

## 2011-05-06 ENCOUNTER — Telehealth: Payer: Self-pay | Admitting: Internal Medicine

## 2011-05-06 NOTE — Telephone Encounter (Signed)
Pt calling for results of test done monday

## 2011-05-06 NOTE — Telephone Encounter (Signed)
I spoke with the patient. She is aware of her results. I discussed the findings with Dr. Shirlee Latch. 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 She should be in close watch of risk factor modification. I have explained this to the patient. She states she had her lipids drawn this week with her PCP, but does not have the results back yet. I made her aware of goals for her lipids. She has questions as to who she should f/u with now since her diagnosis of Raynaud's from the [facility]. She states that limb numbness and tingling is getting worse for her. I explained I would review this with Dr. Graciela Husbands and call her back next week. She would like a copy of her myoview mailed to- will do after MD signature to report.

## 2011-05-11 ENCOUNTER — Encounter: Payer: Self-pay | Admitting: *Deleted

## 2011-05-11 NOTE — Telephone Encounter (Signed)
She can followup with her PCP about the Raynauds Thanks steve

## 2011-05-12 NOTE — Telephone Encounter (Signed)
I left a message of Dr. Odessa Fleming recommendations.

## 2011-08-14 ENCOUNTER — Telehealth: Payer: Self-pay | Admitting: *Deleted

## 2011-08-14 ENCOUNTER — Other Ambulatory Visit: Payer: Self-pay | Admitting: Oncology

## 2011-08-14 ENCOUNTER — Encounter: Payer: Self-pay | Admitting: Oncology

## 2011-08-14 DIAGNOSIS — R229 Localized swelling, mass and lump, unspecified: Secondary | ICD-10-CM

## 2011-08-14 HISTORY — DX: Localized swelling, mass and lump, unspecified: R22.9

## 2011-08-14 NOTE — Telephone Encounter (Signed)
Received vm call from pt stating she has an appt. In March but is having some symptoms & reports some lumps & thinks she needs to be seen sooner.   Returned call @ 1300 & she reports that she had two lumps to come up in her lower back before Christmas & another one popped up @ 1 1/2 wks ago.& they are painful. They range in size from dime to quarter size.   She rates # 7 & motrin doesn't really help.  She also reports 2 lumps in her abdominal area that popped up last week.  She states that the NP at her OB-GYN felt them & they are hard & moveable & about BB size & didn't think they were fatty tumors but didn't do anything.  She reports some nausea & general chest tightness which she relates to her asthma & states that she was seen at the [facility] 6 mo ago & was told that she has some nodules < 2 ml.  She reports that her PCP retired & she would like some input from Dr. Cyndie Chime of what to do & who he would recommend for PCP.

## 2011-08-17 ENCOUNTER — Other Ambulatory Visit: Payer: Self-pay

## 2011-08-17 ENCOUNTER — Telehealth: Payer: Self-pay | Admitting: Oncology

## 2011-08-17 NOTE — Telephone Encounter (Signed)
Lab scheduled for 2/11 visit , waiting for template to open so MD visit can be scheduled, Chelsea Alexander has been informed

## 2011-08-19 ENCOUNTER — Telehealth: Payer: Self-pay | Admitting: *Deleted

## 2011-08-19 NOTE — Telephone Encounter (Signed)
Pt. given recommendation for Dr.Robert Reade/[facility] & Dr. Hoyle Barr for PCP per Dr.Granfortuna

## 2011-08-31 ENCOUNTER — Ambulatory Visit (HOSPITAL_BASED_OUTPATIENT_CLINIC_OR_DEPARTMENT_OTHER): Payer: BC Managed Care – PPO | Admitting: Oncology

## 2011-08-31 ENCOUNTER — Other Ambulatory Visit (HOSPITAL_BASED_OUTPATIENT_CLINIC_OR_DEPARTMENT_OTHER): Payer: BC Managed Care – PPO

## 2011-08-31 ENCOUNTER — Telehealth: Payer: Self-pay | Admitting: Oncology

## 2011-08-31 DIAGNOSIS — R229 Localized swelling, mass and lump, unspecified: Secondary | ICD-10-CM

## 2011-08-31 DIAGNOSIS — R202 Paresthesia of skin: Secondary | ICD-10-CM

## 2011-08-31 DIAGNOSIS — R209 Unspecified disturbances of skin sensation: Secondary | ICD-10-CM

## 2011-08-31 LAB — CBC WITH DIFFERENTIAL/PLATELET
Basophils Absolute: 0 10*3/uL (ref 0.0–0.1)
EOS%: 5.8 % (ref 0.0–7.0)
HCT: 40.6 % (ref 34.8–46.6)
HGB: 13.7 g/dL (ref 11.6–15.9)
MCH: 33.3 pg (ref 25.1–34.0)
MCV: 98.8 fL (ref 79.5–101.0)
MONO%: 6.6 % (ref 0.0–14.0)
NEUT%: 43 % (ref 38.4–76.8)
Platelets: 248 10*3/uL (ref 145–400)

## 2011-08-31 NOTE — Progress Notes (Signed)
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 Up until now she has had normal liver functions and low serum ferritin levels with values ranging between a low of 33 to a high of 91 over the last 2 years. She is menopausal and therefore is not losing blood so it remains a mystery why her ferritin should be so low. In any event, she has not required phlebotomy. I was surprised to see her ferritin today at 208 units which is in outlier compared with all previous values. At this point she has undergone a number of extensive evaluations for atypical neurologic symptoms. Please see my previous notes for full details. She asked to be seen earlier than her scheduled appointment for evaluation of subcutaneous nodules which have popped up in the sacroiliac regions and on the abdominal wall. She has also developed brain this phenomenon over the last year. She is experiencing painful discoloration of her fingers and toes which become grossly cyanotic at times. Not much worse in the cold. She recently had an arterial Doppler studies which suggested some decrease in flow in the right ulnar artery. She was started on a drug called Savella (minacipran) when it was felt that her symptoms could be fibromyalgia. The drug is helped with some of the cognitive side effects that she has experienced but not with any of her other symptoms including lack of energy, paresthesias, epigastric pain and burning or obviously the new symptoms of Reynaud's.  On exam: There are on palpable subcentimeter nondescript movable nodules in the subcutaneous tissues near the sacroiliac joints bilaterally more prominent on the left side. Tiny millimeter sized nodule on the right anterior abdominal wall. No cervical supraclavicular axillary or inguinal lymphadenopathy. Lungs are clear and resonant to percussion regular cardiac rhythm no murmur Abdomen is soft and nontender no mass no  organomegaly Extremities currently warm and well perfused with no cyanosis. Radial arteries are 2+ symmetric ulnar arteries absent but symmetric; dorsalis pedis pulses 2+ symmetric posterior tibial pulses absent symmetric Neurologic: Motor strength is 5 over 5 reflexes are 2+ symmetric coordination is normal there is persistent decreased vibration sensation to a moderate to severe degree over the fingertips by tuning fork exam.  Impression: #1. Homozygous hemachromatosis gene carrier. I cannot explain the sudden rise in her ferritin level unless it is related to developing or chronic inflammation. At this point I think I will just repeat the ferritin level in 3 months. I do not believe that her hemachromatosis carrier state he is related to her multisystem neurologic complaints.  #2. Subcutaneous nodules. Clinically these do not appear to be malignant. However, she is advised that they start to grow or change she'll need to get a consultation with a general surgeon for an incisional biopsy. It  #3. Atypical musculoskeletal, neurologic, and constitutional symptoms. Recent onset Raynaud's phenomenon She could be developing a collagen vascular disorder over time. This would explain many of her complaints. Previous serologic testing has been negative however. She has been in touch with the [facility] clinic. They're going to review the data obtained at the [facility] and data obtained in [place] and decide whether it is worth her while to come therefore a consultation. I told her that it might be reasonable given the new symptoms to get a formal rheumatology consultation if she decides not to go to the [place] clinic. I gave her the name of some local rheumatologist who I feel are excellent.

## 2011-08-31 NOTE — Telephone Encounter (Signed)
Aug and feb/2014 appts made and printed for pt  aom

## 2011-09-01 LAB — COMPREHENSIVE METABOLIC PANEL
AST: 15 U/L (ref 0–37)
Alkaline Phosphatase: 77 U/L (ref 39–117)
BUN: 10 mg/dL (ref 6–23)
Calcium: 9.8 mg/dL (ref 8.4–10.5)
Creatinine, Ser: 0.76 mg/dL (ref 0.50–1.10)

## 2011-09-01 LAB — IRON AND TIBC
%SAT: 54 % (ref 20–55)
TIBC: 300 ug/dL (ref 250–470)

## 2011-09-01 LAB — ANA: Anti Nuclear Antibody(ANA): NEGATIVE

## 2011-09-21 NOTE — Progress Notes (Signed)
Pt's lab results & last office note mailed to her home per her request.  MR release noted in [facility] dph

## 2011-09-25 ENCOUNTER — Other Ambulatory Visit: Payer: BC Managed Care – PPO | Admitting: Lab

## 2011-12-01 ENCOUNTER — Other Ambulatory Visit: Payer: BC Managed Care – PPO

## 2011-12-02 ENCOUNTER — Telehealth: Payer: Self-pay | Admitting: Oncology

## 2011-12-02 NOTE — Telephone Encounter (Signed)
Pt left VM, called pt back, left message regarding lab, pt wants appt to be r/s to another day, waiting for a return call

## 2012-02-24 ENCOUNTER — Other Ambulatory Visit: Payer: BC Managed Care – PPO | Admitting: Lab

## 2012-03-11 ENCOUNTER — Telehealth: Payer: Self-pay | Admitting: *Deleted

## 2012-03-11 NOTE — Telephone Encounter (Signed)
Patient called to say she has lab results - iron levels from her recent visit to [facility].  She would like Dr. Cyndie Chime to review these and see if she needs to be seen or a Phlebotomy sooner than her present schedule- which is currently February 2014.   Gave her fax number and mailing address to send information to [place] for Dr. Patsy Lager review.

## 2012-03-23 NOTE — Progress Notes (Signed)
Spoke to patient [phone]) concerning lab results the she states that she faxed to [place] from her previous physician's office, [facility]; office never received copy of labs; patient states that she will attempt to re-fax labs and she will also mail a copy of labs.

## 2012-03-25 ENCOUNTER — Other Ambulatory Visit: Payer: BC Managed Care – PPO | Admitting: Lab

## 2012-03-29 ENCOUNTER — Telehealth: Payer: Self-pay | Admitting: *Deleted

## 2012-03-29 NOTE — Telephone Encounter (Signed)
Discussed with Dr. Cyndie Chime.  Pt. Is appropriate for phlebotomy.  Called patient and she able to come Monday 9/16.  Will also need future lab/MD appts.  Nothing scheduled till 2/14.  Will let Dr. Cyndie Chime know.

## 2012-03-30 ENCOUNTER — Other Ambulatory Visit: Payer: Self-pay | Admitting: Oncology

## 2012-03-31 ENCOUNTER — Other Ambulatory Visit: Payer: Self-pay | Admitting: *Deleted

## 2012-04-01 ENCOUNTER — Ambulatory Visit: Payer: BC Managed Care – PPO | Admitting: Oncology

## 2012-04-04 ENCOUNTER — Other Ambulatory Visit: Payer: BC Managed Care – PPO | Admitting: Lab

## 2012-04-06 ENCOUNTER — Telehealth: Payer: Self-pay | Admitting: Oncology

## 2012-04-06 NOTE — Telephone Encounter (Signed)
Called pt , left message regarding appt for February 2014 lab and MD

## 2012-04-08 ENCOUNTER — Other Ambulatory Visit: Payer: Self-pay | Admitting: Oncology

## 2012-04-08 ENCOUNTER — Other Ambulatory Visit: Payer: Self-pay | Admitting: *Deleted

## 2012-04-08 ENCOUNTER — Telehealth: Payer: Self-pay | Admitting: *Deleted

## 2012-04-08 NOTE — Telephone Encounter (Signed)
Spoke with pt & given appt for 04/11/12 for phleb & lab.  Informed to come @ 1015am for lab & will discuss with Dr Cyndie Chime when he needs to see her.  She reports having some problems that she needs to discuss with him.

## 2012-04-09 ENCOUNTER — Telehealth: Payer: Self-pay | Admitting: Oncology

## 2012-04-09 NOTE — Telephone Encounter (Signed)
S/w the pt and she is aware of her lab appt on Monday that has been added to the phlebotomy appt and the md appt in oct.

## 2012-04-11 ENCOUNTER — Other Ambulatory Visit: Payer: Self-pay | Admitting: Oncology

## 2012-04-11 ENCOUNTER — Ambulatory Visit (HOSPITAL_BASED_OUTPATIENT_CLINIC_OR_DEPARTMENT_OTHER): Payer: BC Managed Care – PPO

## 2012-04-11 ENCOUNTER — Telehealth: Payer: Self-pay | Admitting: Oncology

## 2012-04-11 ENCOUNTER — Other Ambulatory Visit (HOSPITAL_BASED_OUTPATIENT_CLINIC_OR_DEPARTMENT_OTHER): Payer: BC Managed Care – PPO | Admitting: Lab

## 2012-04-11 LAB — CBC WITH DIFFERENTIAL/PLATELET
BASO%: 0.5 % (ref 0.0–2.0)
Basophils Absolute: 0 10*3/uL (ref 0.0–0.1)
EOS%: 0.7 % (ref 0.0–7.0)
HCT: 41.8 % (ref 34.8–46.6)
HGB: 14 g/dL (ref 11.6–15.9)
MCH: 33.2 pg (ref 25.1–34.0)
MCHC: 33.5 g/dL (ref 31.5–36.0)
MCV: 99.1 fL (ref 79.5–101.0)
MONO%: 9 % (ref 0.0–14.0)
NEUT%: 56.8 % (ref 38.4–76.8)

## 2012-04-11 NOTE — Progress Notes (Signed)
510 cc phlebotomized. Vitals stable. Nourishment provided. No complaints.

## 2012-04-11 NOTE — Patient Instructions (Signed)
Therapeutic Phlebotomy Therapeutic phlebotomy is the controlled removal of blood from your body for the purpose of treating a medical condition. It is similar to donating blood. Usually, about a pint (470 mL) of blood is removed. The average adult has 9 to 12 pints (4.3 to 5.7 L) of blood. Therapeutic phlebotomy may be used to treat the following medical conditions:  Hemochromatosis. This is a condition in which there is too much iron in the blood.   Polycythemia vera. This is a condition in which there are too many red cells in the blood.   Porphyria cutanea tarda. This is a disease usually passed from one generation to the next (inherited). It is a condition in which an important part of hemoglobin is not made properly. This results in the build up of abnormal amounts of porphyrins in the body.   Sickle cell disease. This is an inherited disease. It is a condition in which the red blood cells form an abnormal crescent shape rather than a round shape.  LET YOUR CAREGIVER KNOW ABOUT:  Allergies.   Medicines taken including herbs, eyedrops, over-the-counter medicines, and creams.   Use of steroids (by mouth or creams).   Previous problems with anesthetics or numbing medicine.   History of blood clots.   History of bleeding or blood problems.   Previous surgery.   Possibility of pregnancy, if this applies.  RISKS AND COMPLICATIONS This is a simple and safe procedure. Problems are unlikely. However, problems can occur and may include:  Nausea or lightheadedness.   Low blood pressure.   Soreness, bleeding, swelling, or bruising at the needle insertion site.   Infection.  BEFORE THE PROCEDURE  This is a procedure that can be done as an outpatient. Confirm the time that you need to arrive for your procedure. Confirm whether there is a need to fast or withhold any medications. It is helpful to wear clothing with sleeves that can be raised above the elbow. A blood sample may be done  to determine the amount of red blood cells or iron in your blood. Plan ahead of time to have someone drive you home after the procedure. PROCEDURE The entire procedure from preparation through recovery takes about 1 hour. The actual collection takes about 10 to 15 minutes.  A needle will be inserted into your vein.   Tubing and a collection bag will be attached to that needle.   Blood will flow through the needle and tubing into the collection bag.   You may be asked to open and close your hand slowly and continuously during the entire collection.   Once the specified amount of blood has been removed from your body, the collection bag and tubing will be clamped.   The needle will be removed.   Pressure will be held on the site of the needle insertion to stop the bleeding. Then a bandage will be placed over the needle insertion site.  AFTER THE PROCEDURE  Your recovery will be assessed and monitored. If there are no problems, as an outpatient, you should be able to go home shortly after the procedure.  Document Released: 12/08/2010 Document Revised: 06/25/2011 Document Reviewed: 12/08/2010 [employer] Patient Information 2012 [employer]. 

## 2012-04-11 NOTE — Telephone Encounter (Signed)
Talked to patient and gave her appt for 04/19/12 with MD

## 2012-04-12 ENCOUNTER — Telehealth: Payer: Self-pay | Admitting: *Deleted

## 2012-04-12 NOTE — Telephone Encounter (Signed)
Message copied by Orbie Hurst on Tue Apr 12, 2012  5:47 PM ------      Message from: Levert Feinstein      Created: Mon Apr 11, 2012  8:18 PM       Call pt ferritin was 76 before we even did the phlebotomy!

## 2012-04-12 NOTE — Telephone Encounter (Signed)
Called patient and let her know that ferritin was 76 before we even did the phlebotomy.  She appreciated the phone call.

## 2012-04-19 ENCOUNTER — Ambulatory Visit (HOSPITAL_BASED_OUTPATIENT_CLINIC_OR_DEPARTMENT_OTHER): Payer: BC Managed Care – PPO | Admitting: Oncology

## 2012-04-19 VITALS — BP 107/77 | HR 104 | Temp 97.5°F | Resp 20 | Ht 62.0 in | Wt 119.8 lb

## 2012-04-19 DIAGNOSIS — Z148 Genetic carrier of other disease: Secondary | ICD-10-CM

## 2012-04-19 DIAGNOSIS — R609 Edema, unspecified: Secondary | ICD-10-CM

## 2012-04-19 DIAGNOSIS — T783XXA Angioneurotic edema, initial encounter: Secondary | ICD-10-CM

## 2012-04-19 NOTE — Patient Instructions (Signed)
Call in AM 10/2 for time to come in for special lab

## 2012-04-20 ENCOUNTER — Ambulatory Visit (HOSPITAL_BASED_OUTPATIENT_CLINIC_OR_DEPARTMENT_OTHER): Payer: BC Managed Care – PPO | Admitting: Lab

## 2012-04-20 ENCOUNTER — Telehealth: Payer: Self-pay | Admitting: Oncology

## 2012-04-20 DIAGNOSIS — T783XXA Angioneurotic edema, initial encounter: Secondary | ICD-10-CM

## 2012-04-20 NOTE — Telephone Encounter (Signed)
Pt called in today for lb appt. Per pt JG instructed her to call this morning for lb. Per 10/1 pof lb 10/2 before 1 pm and pt to [facility] for February 2014. Pt given appt for today and will keep appt for February as scheduled.

## 2012-04-20 NOTE — Progress Notes (Signed)
Hematology and Oncology Follow Up Visit  Chelsea Alexander [id number] [birth year] 46 y.o. 04/20/2012 8:10 PM   Principle Diagnosis: Encounter Diagnoses  Name Primary?  . Idiopathic angioedema Yes  . Hereditary hemochromatosis      Interim History:   Unscheduled visit for this 46 year old woman who is primarily followed here for homozygous C282Y. hemachromatosis. She has normal-low ferritin levels and has never required a phlebotomy until last month when a ferritin done at time of a second opinion for other medical problems at the [facility] clinic was reported as 236. When she returned home, we did a phlebotomy. However I repeated a ferritin here prior to the phlebotomy and it was 76 on September 23. We proceeded with the phlebotomy.  She continues to have multiple systemic complaints with no unifying diagnosis.  these complaints are totally unrelated to her hemochromatosis. She has seen multiple local and Paramedic including physicians at [facility], the [facility], and the [facility] clinic. She has seen rheumatologists and neurologists. She has had extensive laboratory and radiographic evaluations which have been unrevealing. She is extremely distraught. Her most constant symptom is her Raynaud's phenomenon. She has started to develop fluctuating areas of edema of her arms, and dependent areas such as her low back when she is lying supine. She has noted violaceous discoloration of her arms and has taken a number of pictures which she showed me today. She recently did get some swelling of the throat and uvula and went to the emergency department. The problem resolved on its own within about 24 hours. She continues to have areas of paresthesias with numbness of her upper back. Numbness of the small finger of her left hand. She continues to have profound fatigue so bad at times that she can't walk. She had some prednisone and started taking the medication on her own and found that this  improved her symptoms.  While at the [facility] clinic she had a transesophageal echocardiogram. She was told she had a small hole in her heart but that this was not significant. She had a subcutaneous nodule biopsied from her low back which  showed some fibroadipose changes. She had a rash on her face which was biopsied and was nonspecific and resolved with a course of antibiotics. She had an MRI of her brain which showed some small nonspecific periventricular white matter changes and an MRI of her LS spine which was normal and showed normal bone marrow signal. She had a another battery of laboratory tests including negative cold agglutinins, lupus anticoagulant, serum protein electrophoresis and immuno fixation electrophoresis, a CRP of 0.1 an ESR of 2 mm, normal C3 complement and C4 complement levels, negative anti-DNA antibodies, normal ACE level at 214. A normal spinal fluid examination with negative oligoclonal bands.  She tells me that she had bilateral saline implants for breast augmentation about 20 years ago and wonders whether they could be breaking down and releasing toxic substances into her blood. I am not aware of this phenomenon with the saline type implants only with silicone.  Medications: reviewed  Allergies:  Allergies  Allergen Reactions  . Compazine Anaphylaxis  . Sulfa Antibiotics Anaphylaxis  . Amoxicillin Hives  . Ciprofloxacin Other (See Comments)    jittery  . Levofloxacin Other (See Comments)    Muscle rigidity  . Tetracyclines & Related Hives  . Diphenhydramine Hcl   . Morphine And Related Other (See Comments)    Crawls out of skin, no pain relief    Review of Systems: Constitutional:  See above Respiratory: No cough or dyspnea Cardiovascular:  No chest pain or palpitations Gastrointestinal: No nausea, vomiting, diarrhea Genito-Urinary: No vaginal bleeding Musculoskeletal: See above Neurologic: No headache or change in vision, for additional symptoms see  above Skin: See above Remaining ROS negative.  Physical Exam: Blood pressure 107/77, pulse 104, temperature 97.5 F (36.4 C), temperature source Oral, resp. rate 20, height 5\' 2"  (1.575 m), weight 119 lb 12.8 oz (54.341 kg). Wt Readings from Last 3 Encounters:  04/19/12 119 lb 12.8 oz (54.341 kg)  08/31/11 118 lb 11.2 oz (53.842 kg)  05/04/11 125 lb (56.7 kg)     General appearance: Thin Caucasian woman well-nourished HENNT: Pharynx no erythema exudate, no swelling of the uvula Lymph nodes: No lymphadenopathy Breasts: Not examined Lungs: Clear to auscultation resonant to percussion Heart: Regular rhythm no murmur Abdomen: Soft, nontender, no mass no organomegaly Extremities: No joint deformities, currently no edema Vascular: Currently no cyanosis Neurologic: She continues to have significant decrease in vibration sensation over the fingertips by tuning fork exam Skin: Currently no rash or ecchymoses  Lab Results: Lab Results  Component Value Date   WBC 8.9 04/11/2012   HGB 14.0 04/11/2012   HCT 41.8 04/11/2012   MCV 99.1 04/11/2012   PLT 231 04/11/2012     Chemistry      Component Value Date/Time   NA 140 08/31/2011 1106   K 3.4* 08/31/2011 1106   CL 104 08/31/2011 1106   CO2 25 08/31/2011 1106   BUN 10 08/31/2011 1106   CREATININE 0.76 08/31/2011 1106      Component Value Date/Time   CALCIUM 9.8 08/31/2011 1106   ALKPHOS 77 08/31/2011 1106   AST 15 08/31/2011 1106   ALT 10 08/31/2011 1106   BILITOT 0.4 08/31/2011 1106       Radiological Studies: See discussion above  Impression and Plan: #1. Hemochromatosis gene carrier without clinical hemachromatosis. We will continue to monitor her ferritin levels through our office.  #2. Idiopathic multisystem disorder The only thing that has not been checked yet is a C1-esterase inhibitor level to rule out angioedema so I'll have her come back and we will check this. I told her this is a long shot and I don't expect it will be  abnormal but in view of recent throat and uvula swelling and development of intermittent areas of edema in her low back and upper extremities, and in view of the fact that we do have medication now that is effective for this disorder, I think it is reasonable to at least check it.   CC:. Dr. Leslee Home; Dr. Fayrene Fearing love   Levert Feinstein, MD 10/2/20138:10 PM

## 2012-04-23 LAB — C1 ESTERASE INHIBITOR: C1INH SerPl-mCnc: 30 mg/dL (ref 21–39)

## 2012-04-25 ENCOUNTER — Telehealth: Payer: Self-pay | Admitting: *Deleted

## 2012-04-25 NOTE — Telephone Encounter (Signed)
Message copied by Orbie Hurst on Mon Apr 25, 2012  2:24 PM ------      Message from: Levert Feinstein      Created: Mon Apr 25, 2012  9:45 AM       Call patient  Fancy lab test came back normal  No sign of hereditary angio edema

## 2012-04-25 NOTE — Telephone Encounter (Signed)
Called patient and let her know that lab test was normal.  No sign of hereditary angio edema.  Reviewed next appts.  Feb. 2014 with her

## 2012-07-29 ENCOUNTER — Other Ambulatory Visit: Payer: Self-pay | Admitting: Physician Assistant

## 2012-07-29 DIAGNOSIS — Z1231 Encounter for screening mammogram for malignant neoplasm of breast: Secondary | ICD-10-CM

## 2012-08-30 ENCOUNTER — Other Ambulatory Visit (HOSPITAL_BASED_OUTPATIENT_CLINIC_OR_DEPARTMENT_OTHER): Payer: BC Managed Care – PPO | Admitting: Lab

## 2012-08-30 ENCOUNTER — Ambulatory Visit: Payer: BC Managed Care – PPO | Admitting: Oncology

## 2012-08-30 LAB — COMPREHENSIVE METABOLIC PANEL (CC13)
ALT: 20 U/L (ref 0–55)
CO2: 32 mEq/L — ABNORMAL HIGH (ref 22–29)
Chloride: 101 mEq/L (ref 98–107)
Sodium: 141 mEq/L (ref 136–145)
Total Bilirubin: 0.3 mg/dL (ref 0.20–1.20)
Total Protein: 7.1 g/dL (ref 6.4–8.3)

## 2012-08-30 LAB — CBC WITH DIFFERENTIAL/PLATELET
BASO%: 1.5 % (ref 0.0–2.0)
Basophils Absolute: 0.1 10*3/uL (ref 0.0–0.1)
EOS%: 6.1 % (ref 0.0–7.0)
HCT: 40.1 % (ref 34.8–46.6)
HGB: 13.9 g/dL (ref 11.6–15.9)
LYMPH%: 48.9 % (ref 14.0–49.7)
MCH: 32.8 pg (ref 25.1–34.0)
MCHC: 34.7 g/dL (ref 31.5–36.0)
MCV: 94.5 fL (ref 79.5–101.0)
MONO%: 9.1 % (ref 0.0–14.0)
NEUT%: 34.4 % — ABNORMAL LOW (ref 38.4–76.8)
Platelets: 229 10*3/uL (ref 145–400)
lymph#: 2.8 10*3/uL (ref 0.9–3.3)

## 2012-08-31 ENCOUNTER — Telehealth: Payer: Self-pay | Admitting: *Deleted

## 2012-08-31 ENCOUNTER — Telehealth: Payer: Self-pay | Admitting: Oncology

## 2012-08-31 NOTE — Telephone Encounter (Signed)
Pt notified of ferritin results & no need for phlebotomy at this time.  She would like to cancel tomorrow's appt & r/s.  She was transferred to [facility].

## 2012-08-31 NOTE — Telephone Encounter (Signed)
Message copied by Sabino Snipes on Wed Aug 31, 2012 12:19 PM ------      Message from: Levert Feinstein      Created: Wed Aug 31, 2012  8:30 AM       Call pt - ferritin remains low at 26  No need for phlebotomy at this time ------

## 2012-09-01 ENCOUNTER — Ambulatory Visit: Payer: BC Managed Care – PPO | Admitting: Oncology

## 2012-09-06 ENCOUNTER — Telehealth: Payer: Self-pay | Admitting: Oncology

## 2012-09-06 ENCOUNTER — Ambulatory Visit (HOSPITAL_BASED_OUTPATIENT_CLINIC_OR_DEPARTMENT_OTHER): Payer: BC Managed Care – PPO | Admitting: Oncology

## 2012-09-06 NOTE — Telephone Encounter (Signed)
Called pt and leftb message regarding appt for February 2015 lab and MD

## 2012-09-06 NOTE — Progress Notes (Signed)
Hematology and Oncology Follow Up Visit  Georgena Weisheit [id number] 07-22-1965 47 y.o. 09/06/2012 4:24 PM   Principle Diagnosis: Encounter Diagnosis  Name Primary?  . Hereditary hemochromatosis Yes     Interim History:   Followup visit for this 47 year old woman who is a homozygote for the C282Y hemachromatosis gene. Multiple family members are affected. Her ferritins have run low ever since she first established with our practice in January 2011 and she has only required one phlebotomy in February 2013 when ferritin was 208. I believe this was likely an acute phase reactant since all other values have been less than 100 including value done in anticipation of today's visit on 08/30/2012 which is 26.  Her main complaints still revolve around a idiopathic condition with multiple systemic symptoms with the most prominent symptoms being unexplained paresthesias and numbness which is migratory but frequently affects her back, and hands. She also has idiopathic edema. [facility] and through this office have not yielded a unifying diagnosis. Please see my 04/19/2012 note for additional details of previous evaluations.  She is accompanied by her husband today.  Medications: reviewed  Allergies:  Allergies  Allergen Reactions  . Compazine Anaphylaxis  . Sulfa Antibiotics Anaphylaxis  . Amoxicillin Hives  . Ciprofloxacin Other (See Comments)    jittery  . Levofloxacin Other (See Comments)    Muscle rigidity  . Tetracyclines & Related Hives  . Diphenhydramine Hcl   . Morphine And Related Other (See Comments)    Crawls out of skin, no pain relief    Review of Systems: Constitutional:   Chronic fatigue Respiratory: No dyspnea Cardiovascular: No chest pain or palpitations   Gastrointestinal: No change in bowel habit Genito-Urinary: Not questioned Musculoskeletal: Chronic Raynaud's phenomenon Neurologic: Chronic migratory  paresthesias. Skin: No rash or ecchymoses Remaining ROS negative.  Physical Exam: Blood pressure 117/72, pulse 103, temperature 98.1 F (36.7 C), temperature source Oral, resp. rate 18, weight 132 lb 9.6 oz (60.147 kg). Wt Readings from Last 3 Encounters:  09/06/12 132 lb 9.6 oz (60.147 kg)  04/19/12 119 lb 12.8 oz (54.341 kg)  08/31/11 118 lb 11.2 oz (53.842 kg)     General appearance: Petite Caucasian woman HENNT: Pharynx no erythema or exudate Lymph nodes: No lymphadenopathy Breasts: Lungs: Clear to auscultation resonant to percussion Heart: Regular rhythm no murmur Abdomen: Soft, nontender, no mass, no organomegaly Extremities: No edema, no calf tenderness Vascular: No cyanosis Neurologic: Mental status intact, cranial nerves grossly normal, motor strength 5 over 5, reflexes 1+ symmetric Skin: No rash or ecchymosis  Lab Results: Lab Results  Component Value Date   WBC 5.7 08/30/2012   HGB 13.9 08/30/2012   HCT 40.1 08/30/2012   MCV 94.5 08/30/2012   PLT 229 08/30/2012     Chemistry      Component Value Date/Time   NA 141 08/30/2012 1054   NA 140 08/31/2011 1106   K 3.8 08/30/2012 1054   K 3.4* 08/31/2011 1106   CL 101 08/30/2012 1054   CL 104 08/31/2011 1106   CO2 32* 08/30/2012 1054   CO2 25 08/31/2011 1106   BUN 13.1 08/30/2012 1054   BUN 10 08/31/2011 1106   CREATININE 0.8 08/30/2012 1054   CREATININE 0.76 08/31/2011 1106      Component Value Date/Time   CALCIUM 9.9 08/30/2012 1054   CALCIUM 9.8 08/31/2011 1106   ALKPHOS 106 08/30/2012 1054   ALKPHOS 77 08/31/2011 1106   AST 22 08/30/2012 1054  AST 15 08/31/2011 1106   ALT 20 08/30/2012 1054   ALT 10 08/31/2011 1106   BILITOT 0.30 08/30/2012 1054   BILITOT 0.4 08/31/2011 1106       Radiological Studies: No results found.  Impression and Plan: #1. Homozygous status for the major hemachromatosis gene but consistently low ferritins. I will keep her on an every 4 month surveillance program. She only needs phlebotomy on  a when necessary basis.  #2. Unexplained complex systemic symptoms with no unifying diagnosis. At time of last visit here I checked a C1 esterase inhibitor level which was normal. She has no monoclonal proteins on immunofixation electrophoresis of the serum and no cryoglobulins on repetitive testing. She does have a shift in her differential towards lymphocytes on most white cell differentials. It is possible that she has an unusual viral infection that has triggered a chronic systemic immune response. Unfortunately, there is no specific treatment since we don't have a specific diagnosis.   CC:.    Levert Feinstein, MD 2/18/20144:24 PM

## 2013-04-25 ENCOUNTER — Other Ambulatory Visit: Payer: Self-pay | Admitting: Obstetrics and Gynecology

## 2013-04-25 DIAGNOSIS — Z9882 Breast implant status: Secondary | ICD-10-CM

## 2013-05-02 ENCOUNTER — Ambulatory Visit
Admission: RE | Admit: 2013-05-02 | Discharge: 2013-05-02 | Disposition: A | Discharge status: Home / Self Care | Payer: BC Managed Care – PPO | Source: Ambulatory Visit | Attending: Obstetrics and Gynecology | Admitting: Obstetrics and Gynecology

## 2013-05-02 DIAGNOSIS — Z9882 Breast implant status: Secondary | ICD-10-CM

## 2013-05-05 ENCOUNTER — Other Ambulatory Visit: Payer: BC Managed Care – PPO

## 2013-05-25 ENCOUNTER — Other Ambulatory Visit: Payer: Self-pay

## 2013-08-31 ENCOUNTER — Encounter (HOSPITAL_COMMUNITY): Payer: Self-pay | Admitting: Emergency Medicine

## 2013-08-31 ENCOUNTER — Emergency Department (HOSPITAL_COMMUNITY)
Admission: EM | Admit: 2013-08-31 | Discharge: 2013-08-31 | Disposition: A | Discharge status: Home / Self Care | Payer: BC Managed Care – PPO | Attending: Emergency Medicine | Admitting: Emergency Medicine

## 2013-08-31 ENCOUNTER — Emergency Department (HOSPITAL_COMMUNITY): Payer: BC Managed Care – PPO

## 2013-08-31 DIAGNOSIS — F411 Generalized anxiety disorder: Secondary | ICD-10-CM | POA: Insufficient documentation

## 2013-08-31 DIAGNOSIS — Z862 Personal history of diseases of the blood and blood-forming organs and certain disorders involving the immune mechanism: Secondary | ICD-10-CM | POA: Insufficient documentation

## 2013-08-31 DIAGNOSIS — R0789 Other chest pain: Secondary | ICD-10-CM | POA: Insufficient documentation

## 2013-08-31 DIAGNOSIS — R251 Tremor, unspecified: Secondary | ICD-10-CM

## 2013-08-31 DIAGNOSIS — R079 Chest pain, unspecified: Secondary | ICD-10-CM

## 2013-08-31 DIAGNOSIS — R42 Dizziness and giddiness: Secondary | ICD-10-CM | POA: Insufficient documentation

## 2013-08-31 DIAGNOSIS — Z79899 Other long term (current) drug therapy: Secondary | ICD-10-CM | POA: Insufficient documentation

## 2013-08-31 DIAGNOSIS — G8929 Other chronic pain: Secondary | ICD-10-CM | POA: Insufficient documentation

## 2013-08-31 DIAGNOSIS — Z8619 Personal history of other infectious and parasitic diseases: Secondary | ICD-10-CM | POA: Insufficient documentation

## 2013-08-31 DIAGNOSIS — Z8719 Personal history of other diseases of the digestive system: Secondary | ICD-10-CM | POA: Insufficient documentation

## 2013-08-31 DIAGNOSIS — R259 Unspecified abnormal involuntary movements: Secondary | ICD-10-CM | POA: Insufficient documentation

## 2013-08-31 DIAGNOSIS — J45909 Unspecified asthma, uncomplicated: Secondary | ICD-10-CM | POA: Insufficient documentation

## 2013-08-31 DIAGNOSIS — Z8639 Personal history of other endocrine, nutritional and metabolic disease: Secondary | ICD-10-CM | POA: Insufficient documentation

## 2013-08-31 HISTORY — DX: Lyme disease, unspecified: A69.20

## 2013-08-31 LAB — POCT I-STAT TROPONIN I
TROPONIN I, POC: 0.01 ng/mL (ref 0.00–0.08)
Troponin i, poc: 0.01 ng/mL (ref 0.00–0.08)

## 2013-08-31 LAB — COMPREHENSIVE METABOLIC PANEL
ALT: 20 U/L (ref 0–35)
AST: 22 U/L (ref 0–37)
Albumin: 3.8 g/dL (ref 3.5–5.2)
Alkaline Phosphatase: 108 U/L (ref 39–117)
BUN: 6 mg/dL (ref 6–23)
CALCIUM: 9 mg/dL (ref 8.4–10.5)
CO2: 27 meq/L (ref 19–32)
CREATININE: 0.66 mg/dL (ref 0.50–1.10)
Chloride: 100 mEq/L (ref 96–112)
GFR calc non Af Amer: >90 mL/min (ref >90)
Glucose, Bld: 140 mg/dL — ABNORMAL HIGH (ref 70–99)
Potassium: 3.7 mEq/L (ref 3.7–5.3)
Sodium: 139 mEq/L (ref 137–147)
Total Bilirubin: 0.5 mg/dL (ref 0.3–1.2)
Total Protein: 6.4 g/dL (ref 6.0–8.3)

## 2013-08-31 LAB — CBC
HEMATOCRIT: 35.6 % — AB (ref 36.0–46.0)
HEMOGLOBIN: 12.2 g/dL (ref 12.0–15.0)
MCH: 31.7 pg (ref 26.0–34.0)
MCHC: 34.3 g/dL (ref 30.0–36.0)
MCV: 92.5 fL (ref 78.0–100.0)
Platelets: 172 10*3/uL (ref 150–400)
RBC: 3.85 MIL/uL — AB (ref 3.87–5.11)
RDW: 12.1 % (ref 11.5–15.5)
WBC: 7.2 10*3/uL (ref 4.0–10.5)

## 2013-08-31 MED ORDER — NITROGLYCERIN 0.4 MG SL SUBL
0.4000 mg | SUBLINGUAL_TABLET | SUBLINGUAL | Status: DC | PRN
  Administered 2013-08-31: 0.4 mg via SUBLINGUAL
  Filled 2013-08-31: qty 25

## 2013-08-31 MED ORDER — ASPIRIN 81 MG PO CHEW
324.0000 mg | CHEWABLE_TABLET | Freq: Once | ORAL | Status: AC
  Administered 2013-08-31: 324 mg via ORAL
  Filled 2013-08-31: qty 4

## 2013-08-31 NOTE — ED Notes (Signed)
ED PA at bedside

## 2013-08-31 NOTE — ED Provider Notes (Signed)
Medical screening examination/treatment/procedure(s) were performed by non-physician practitioner and as supervising physician I was immediately available for consultation/collaboration.  EKG Interpretation    Date/Time:  Thursday August 31 2013 04:27:07 EST Ventricular Rate:  90 PR Interval:  185 QRS Duration: 70 QT Interval:  360 QTC Calculation: 440 R Axis:   0 Text Interpretation:  Sinus rhythm Atrial premature complex Low voltage, extremity and precordial leads No significant change since last tracing Confirmed by Naaman Curro  MD, Robert Sperl [phone]) on 08/31/2013 4:31:55 AM             Sunnie NielsenBrian Rodgers Likes, MD 08/31/13 2330

## 2013-08-31 NOTE — Discharge Instructions (Signed)
Continue regular medications. You can start taking baby aspirin daily. Follow up with neurology regarding your leg shakes and with cardiology regarding your chest pain. Return if worsening.    Chest Pain (Nonspecific) It is often hard to give a specific diagnosis for the cause of chest pain. There is always a chance that your pain could be related to something serious, such as a heart attack or a blood clot in the lungs. You need to follow up with your caregiver for further evaluation. CAUSES   Heartburn.  Pneumonia or bronchitis.  Anxiety or stress.  Inflammation around your heart (pericarditis) or lung (pleuritis or pleurisy).  A blood clot in the lung.  A collapsed lung (pneumothorax). It can develop suddenly on its own (spontaneous pneumothorax) or from injury (trauma) to the chest.  Shingles infection (herpes zoster virus). The chest wall is composed of bones, muscles, and cartilage. Any of these can be the source of the pain.  The bones can be bruised by injury.  The muscles or cartilage can be strained by coughing or overwork.  The cartilage can be affected by inflammation and become sore (costochondritis). DIAGNOSIS  Lab tests or other studies, such as X-rays, electrocardiography, stress testing, or cardiac imaging, may be needed to find the cause of your pain.  TREATMENT   Treatment depends on what may be causing your chest pain. Treatment may include:  Acid blockers for heartburn.  Anti-inflammatory medicine.  Pain medicine for inflammatory conditions.  Antibiotics if an infection is present.  You may be advised to change lifestyle habits. This includes stopping smoking and avoiding alcohol, caffeine, and chocolate.  You may be advised to keep your head raised (elevated) when sleeping. This reduces the chance of acid going backward from your stomach into your esophagus.  Most of the time, nonspecific chest pain will improve within 2 to 3 days with rest and mild  pain medicine. HOME CARE INSTRUCTIONS   If antibiotics were prescribed, take your antibiotics as directed. Finish them even if you start to feel better.  For the next few days, avoid physical activities that bring on chest pain. Continue physical activities as directed.  Do not smoke.  Avoid drinking alcohol.  Only take over-the-counter or prescription medicine for pain, discomfort, or fever as directed by your caregiver.  Follow your caregiver's suggestions for further testing if your chest pain does not go away.  Keep any follow-up appointments you made. If you do not go to an appointment, you could develop lasting (chronic) problems with pain. If there is any problem keeping an appointment, you must call to reschedule. SEEK MEDICAL CARE IF:   You think you are having problems from the medicine you are taking. Read your medicine instructions carefully.  Your chest pain does not go away, even after treatment.  You develop a rash with blisters on your chest. SEEK IMMEDIATE MEDICAL CARE IF:   You have increased chest pain or pain that spreads to your arm, neck, jaw, back, or abdomen.  You develop shortness of breath, an increasing cough, or you are coughing up blood.  You have severe back or abdominal pain, feel nauseous, or vomit.  You develop severe weakness, fainting, or chills.  You have a fever. THIS IS AN EMERGENCY. Do not wait to see if the pain will go away. Get medical help at once. Call your local emergency services (911 in [place] Do not drive yourself to the hospital. MAKE SURE YOU:   Understand these instructions.  Will watch  your condition.  Will get help right away if you are not doing well or get worse. Document Released: 04/15/2005 Document Revised: 09/28/2011 Document Reviewed: 02/09/2008 [facility] Patient Information 2014 [place].

## 2013-08-31 NOTE — ED Notes (Signed)
Pt c/o chest pain under L breast, nonradiating, burning in nature. Intermittent in severity, began when she was attempting to go to sleep. Pt reports, SHOB, lightheadedness and difficulty concentrating.

## 2013-08-31 NOTE — ED Provider Notes (Signed)
CSN: [id number]     Arrival date & time 08/31/13  0417 History   First MD Initiated Contact with Patient 08/31/13 [phone]     Chief Complaint  Patient presents with  . Chest Pain     (Consider location/radiation/quality/duration/timing/severity/associated sxs/prior Treatment) HPI Chelsea Alexander is a 48 y.o. female who presents emergency department complaining of chest pain. Patient states that her pain began around 11 PM last night when she went to bed. She described it as sharp the [place] pressure. It's in the left chest. It does not radiate. States it felt like a possible acid reflux, states she took Pepcid around midnight. She states pain came and went overnight. She denies any prior exertional chest pain. She states she did have similar pain in the past once when she was in a hot time. She states she did get dizzy she fell the pain and states that her legs were trembling. States she has had on and off tremors in her "spine radiating to the legs or arms" for the last 3 months. She states when she has these tremors she is unable to control her legs or arms. She denies any past cardiac history. She states she does have history of hemochromatosis, and currently is being treated for Lyme disease. Patient denies any associated shortness of breath or nausea. She's unsure what her cholesterol is. States her father had MI at age 90, no coronary disease in her mother or any other family members. She denies any recent travel or surgeries.   Past Medical History  Diagnosis Date  . Anxiety   . Asthma   . Attention deficit disorder     without hyperactivity  . Chronic low back pain   . Fatigue   . GERD (gastroesophageal reflux disease)   . Muscle weakness   . Hemochromatosis, hereditary   . Palpitations   . Tremor   . Generalized subcutaneous nodules 08/14/2011  . Lyme disease     tx in [place]   Past Surgical History  Procedure Laterality Date  . Breast enhancement surgery  1991  . Cesarean section   2004  . Nasal sinus surgery  2000  . Cholecystectomy  2011   No family history on file. History  Substance Use Topics  . Smoking status: Never Smoker   . Smokeless tobacco: Never Used  . Alcohol Use: No   OB History   Grav Para Term Preterm Abortions TAB SAB Ect Mult Living                 Review of Systems  Constitutional: Negative for fever and chills.  Respiratory: Positive for chest tightness. Negative for cough and shortness of breath.   Cardiovascular: Positive for chest pain. Negative for palpitations and leg swelling.  Gastrointestinal: Negative for nausea, vomiting, abdominal pain and diarrhea.  Genitourinary: Negative for dysuria and flank pain.  Musculoskeletal: Negative for arthralgias, myalgias, neck pain and neck stiffness.  Skin: Negative for rash.  Neurological: Positive for dizziness and light-headedness. Negative for weakness and headaches.  All other systems reviewed and are negative.      Allergies  Compazine; Sulfa antibiotics; Amoxicillin; Ciprofloxacin; Levofloxacin; Tetracyclines & related; Diphenhydramine hcl; and Morphine and related  Home Medications   Current Outpatient Rx  Name  Route  Sig  Dispense  Refill  . ALPRAZolam (XANAX) 0.25 MG tablet   Oral   Take 1 mg by mouth at bedtime. Dose increased by Brandon Melnick PA with [facility]         .  artemether-lumefantrine (COARTEM) 20-120 MG TABS tablet   Oral   Take 4 tablets by mouth 2 (two) times daily. Take 4 tablets by mouth twice per day, stop all other medications while on this-repeat in two weeks         . DULoxetine (CYMBALTA) 60 MG capsule   Oral   Take 60 mg by mouth daily.         Marland Kitchen EPINEPHrine (EPIPEN JR) 0.15 MG/0.3ML injection   Intramuscular   Inject 0.15 mg into the muscle as needed.           . levalbuterol (XOPENEX HFA) 45 MCG/ACT inhaler   Inhalation   Inhale 2 puffs into the lungs every 4 (four) hours as needed.           Marland Kitchen oxymorphone (OPANA ER) 10 MG 12 hr  tablet   Oral   Take 10 mg by mouth every 12 (twelve) hours.          BP 92/53  Pulse 103  Temp(Src) 98 F (36.7 C) (Oral)  Resp 18  Ht 5\' 2"  (1.575 m)  Wt 126 lb (57.153 kg)  BMI 23.04 kg/m2  SpO2 96% Physical Exam  Nursing note and vitals reviewed. Constitutional: She is oriented to person, place, and time. She appears well-developed and well-nourished. No distress.  HENT:  Head: Normocephalic.  Eyes: Conjunctivae are normal.  Neck: Neck supple.  Cardiovascular: Normal rate, regular rhythm and normal heart sounds.   Pulmonary/Chest: Effort normal and breath sounds normal. No respiratory distress. She has no wheezes. She has no rales. She exhibits no tenderness.  Abdominal: Soft. Bowel sounds are normal. She exhibits no distension. There is no tenderness. There is no rebound.  Musculoskeletal: She exhibits no edema.  Neurological: She is alert and oriented to person, place, and time.  Skin: Skin is warm and dry.  Psychiatric: She has a normal mood and affect. Her behavior is normal.    ED Course  Procedures (including critical care time) Labs Review Labs Reviewed  CBC - Abnormal; Notable for the following:    RBC 3.85 (*)    HCT 35.6 (*)    All other components within normal limits  COMPREHENSIVE METABOLIC PANEL - Abnormal; Notable for the following:    Glucose, Bld 140 (*)    All other components within normal limits  POCT I-STAT TROPONIN I   Imaging Review Dg Chest Portable 1 View  08/31/2013   CLINICAL DATA:  Nonsmoker with chest pain.  EXAM: PORTABLE CHEST - 1 VIEW  COMPARISON:  None.  FINDINGS: Cardiopericardial silhouette within normal limits. Mediastinal contours normal. Trachea midline. No airspace disease or effusion. Monitoring leads project over the chest.  IMPRESSION: No active disease.   Electronically Signed   By: Andreas Newport M.D.   On: 08/31/2013 05:13    EKG Interpretation    Date/Time:  Thursday August 31 2013 04:27:07 EST Ventricular Rate:   90 PR Interval:  185 QRS Duration: 70 QT Interval:  360 QTC Calculation: 440 R Axis:   0 Text Interpretation:  Sinus rhythm Atrial premature complex Low voltage, extremity and precordial leads No significant change since last tracing Confirmed by OPITZ  MD, BRIAN ([id number]) on 08/31/2013 4:31:55 AM            MDM   Final diagnoses:  Chest pain  Occasional tremors     Patient in emergency with atypical chest pain. Did improve after taking Pepcid. Her pain is nonexertional, she does not have any risk  factors for heart disease. She is currently symptom-free. Her EKG and initial labs and troponins are negative. Will repeat a delta troponin and reassessed.  8:25 AM Pt's 2nd troponin is negative. She is concerned about her tremors. At this time no signs of acute MI, given low risk, will d/c home with outpatient referral to cardiology, pt does not have a PCP. Will start on baby aspirin. As far as tremmors, I have not seen them in ED. Unsure what they are from descriptions. Pt concerned about possible focal seizures or if they are "heart related." I explained to get I am not sure if they are hear related, very unlikely. She requeted neurology referral. Will refer. Home with follow up.   Pt asking for something to help her sleep. She is already taking zanax, and opana 10mg  twice a day. Do not feel comfortable giving her anymore sedating medications. Instructed to try melatonin.   Filed Vitals:   08/31/13 0425 08/31/13 0513 08/31/13 0747  BP: 105/74 92/53 116/69  Pulse: 91 103 98  Temp: 98 F (36.7 C)  98.3 F (36.8 C)  TempSrc: Oral  Oral  Resp: 15 18   Height: 5\' 2"  (1.575 m)    Weight: 126 lb (57.153 kg)    SpO2: 99% 96% 92%     Lottie Musselatyana A Jaiana Sheffer, PA-C 08/31/13 [phone]

## 2013-09-05 ENCOUNTER — Other Ambulatory Visit (HOSPITAL_BASED_OUTPATIENT_CLINIC_OR_DEPARTMENT_OTHER): Payer: BC Managed Care – PPO

## 2013-09-05 ENCOUNTER — Ambulatory Visit (HOSPITAL_BASED_OUTPATIENT_CLINIC_OR_DEPARTMENT_OTHER): Payer: BC Managed Care – PPO | Admitting: Oncology

## 2013-09-05 LAB — CBC WITH DIFFERENTIAL/PLATELET
BASO%: 0.7 % (ref 0.0–2.0)
BASOS ABS: 0 10*3/uL (ref 0.0–0.1)
EOS%: 8 % — AB (ref 0.0–7.0)
Eosinophils Absolute: 0.5 10*3/uL (ref 0.0–0.5)
HCT: 39.1 % (ref 34.8–46.6)
HEMOGLOBIN: 13.2 g/dL (ref 11.6–15.9)
LYMPH%: 43.5 % (ref 14.0–49.7)
MCH: 31.1 pg (ref 25.1–34.0)
MCHC: 33.8 g/dL (ref 31.5–36.0)
MCV: 92.2 fL (ref 79.5–101.0)
MONO#: 0.5 10*3/uL (ref 0.1–0.9)
MONO%: 8.1 % (ref 0.0–14.0)
NEUT#: 2.4 10*3/uL (ref 1.5–6.5)
NEUT%: 39.7 % (ref 38.4–76.8)
PLATELETS: 250 10*3/uL (ref 145–400)
RBC: 4.24 10*6/uL (ref 3.70–5.45)
RDW: 12.1 % (ref 11.2–14.5)
WBC: 5.9 10*3/uL (ref 3.9–10.3)
lymph#: 2.6 10*3/uL (ref 0.9–3.3)

## 2013-09-05 LAB — COMPREHENSIVE METABOLIC PANEL
ALBUMIN: 4.2 g/dL (ref 3.5–5.2)
ALK PHOS: 98 U/L (ref 39–117)
ALT: 45 U/L — ABNORMAL HIGH (ref 0–35)
AST: 49 U/L — AB (ref 0–37)
BILIRUBIN TOTAL: 0.3 mg/dL (ref 0.2–1.2)
BUN: 7 mg/dL (ref 6–23)
CO2: 33 mEq/L — ABNORMAL HIGH (ref 19–32)
Calcium: 9.4 mg/dL (ref 8.4–10.5)
Chloride: 98 mEq/L (ref 96–112)
Creatinine, Ser: 0.7 mg/dL (ref 0.50–1.10)
Glucose, Bld: 100 mg/dL — ABNORMAL HIGH (ref 70–99)
Potassium: 3.7 mEq/L (ref 3.5–5.3)
Sodium: 138 mEq/L (ref 135–145)
Total Protein: 6.9 g/dL (ref 6.0–8.3)

## 2013-09-05 LAB — FERRITIN: Ferritin: 108 ng/mL (ref 10–291)

## 2013-09-05 NOTE — Progress Notes (Signed)
Hematology and Oncology Follow Up Visit  Chelsea Alexander [id number] 1965-08-12 48 y.o. 09/05/2013 4:03 PM   Principle Diagnosis: Encounter Diagnosis  Name Primary?  . 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 Despite this, she consistently has ferritin levels less than 100 and has not required phlebotomy. Her mother, 2 sisters, and a maternal uncle also affected. Uncle had the disease and ultimately required a liver transplant and died of complications. The mother and one of the other sisters also homozygotes and have not required phlebotomy. One sister requires intermittent phlebotomy. She continues to struggle with idiopathic or neurologic symptoms. She has seen multiple specialists without any unifying diagnosis being established. She is currently seeing a Dr. Orie Fisherman in [place]. She did an extensive panel of additional laboratory studies to look for potential infectious etiologies including Lyme disease, babesiosis, and Bartonella all of which were negative. However she decided to treat her empirically for Lyme disease. She checked C4 complement levels which were, if anything, elevated. So far she has not felt any new aeration of her symptoms with antibiotic and other adjunctive therapy for Lyme disease.   Medications: reviewed  Allergies:  Allergies  Allergen Reactions  . Compazine Anaphylaxis  . Sulfa Antibiotics Anaphylaxis  . Amoxicillin Hives  . Ciprofloxacin Other (See Comments)    jittery  . Levofloxacin Other (See Comments)    Muscle rigidity  . Tetracyclines & Related Hives  . Diphenhydramine Hcl   . Morphine And Related Other (See Comments)    Crawls out of skin, no pain relief    Review of Systems: See history of present illness. Remaining ROS negative:   Physical Exam: Blood pressure 101/61, pulse 89, temperature 97 F (36.1 C),  temperature source Oral, resp. rate 18, height 5\' 2"  (1.575 m), weight 129 lb 11.2 oz (58.832 kg), SpO2 96.00%. Wt Readings from Last 3 Encounters:  09/05/13 129 lb 11.2 oz (58.832 kg)  08/31/13 126 lb (57.153 kg)  09/06/12 132 lb 9.6 oz (60.147 kg)     General appearance: Well-nourished Caucasian woman HENNT: Pharynx no erythema, exudate, mass, or ulcer. No thyromegaly or thyroid nodules Lymph nodes: No cervical, supraclavicular, or axillary lymphadenopathy Breasts:  Lungs: Clear to auscultation, resonant to percussion throughout Heart: Regular rhythm, no murmur, no gallop, no rub, no click, no edema Abdomen: Soft, nontender, normal bowel sounds, no mass, no organomegaly Extremities: No edema, no calf tenderness Musculoskeletal: no joint deformities GU:  Vascular: Carotid pulses 2+, no bruits,  Neurologic: Alert, oriented, PERRLA,   cranial nerves grossly normal, motor strength 5 over 5, reflexes 1+ symmetric, upper body coordination normal, gait normal, Skin: No rash or ecchymosis  Lab Results: CBC W/Diff    Component Value Date/Time   WBC 5.9 09/05/2013 1208   WBC 7.2 08/31/2013 0445   RBC 4.24 09/05/2013 1208   RBC 3.85* 08/31/2013 0445   HGB 13.2 09/05/2013 1208   HGB 12.2 08/31/2013 0445   HCT 39.1 09/05/2013 1208   HCT 35.6* 08/31/2013 0445   PLT 250 09/05/2013 1208   PLT 172 08/31/2013 0445   MCV 92.2 09/05/2013 1208   MCV 92.5 08/31/2013 0445   MCH 31.1 09/05/2013 1208   MCH 31.7 08/31/2013 0445   MCHC 33.8 09/05/2013 1208   MCHC 34.3 08/31/2013 0445   RDW 12.1 09/05/2013 1208   RDW 12.1 08/31/2013 0445   LYMPHSABS 2.6 09/05/2013 1208  LYMPHSABS 1.5 09/24/2010 2236   MONOABS 0.5 09/05/2013 1208   MONOABS 0.3 09/24/2010 2236   EOSABS 0.5 09/05/2013 1208   EOSABS 0.0 09/24/2010 2236   BASOSABS 0.0 09/05/2013 1208   BASOSABS 0.0 09/24/2010 2236     Chemistry      Component Value Date/Time   NA 139 08/31/2013 0445   NA 141 08/30/2012 1054   K 3.7 08/31/2013 0445   K 3.8 08/30/2012 1054    CL 100 08/31/2013 0445   CL 101 08/30/2012 1054   CO2 27 08/31/2013 0445   CO2 32* 08/30/2012 1054   BUN 6 08/31/2013 0445   BUN 13.1 08/30/2012 1054   CREATININE 0.66 08/31/2013 0445   CREATININE 0.8 08/30/2012 1054      Component Value Date/Time   CALCIUM 9.0 08/31/2013 0445   CALCIUM 9.9 08/30/2012 1054   ALKPHOS 108 08/31/2013 0445   ALKPHOS 106 08/30/2012 1054   AST 22 08/31/2013 0445   AST 22 08/30/2012 1054   ALT 20 08/31/2013 0445   ALT 20 08/30/2012 1054   BILITOT 0.5 08/31/2013 0445   BILITOT 0.30 08/30/2012 1054       Radiological Studies: Dg Chest Portable 1 View  08/31/2013   CLINICAL DATA:  Nonsmoker with chest pain.  EXAM: PORTABLE CHEST - 1 VIEW  COMPARISON:  None.  FINDINGS: Cardiopericardial silhouette within normal limits. Mediastinal contours normal. Trachea midline. No airspace disease or effusion. Monitoring leads project over the chest.  IMPRESSION: No active disease.   Electronically Signed   By: Andreas NewportGeoffrey  Lamke M.D.   On: 08/31/2013 05:13    Impression:  #1. 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 Liver functions remain normal. Most recent ferritin done February 2014 was 26 units. Today's value pending. I will continue to monitor her on an annual basis.  #2. 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    CC: Patient Care Team: Reuben Likesavid C Keller, MD as PCP - General (Family Medicine)   Levert FeinsteinGRANFORTUNA,Tykisha Areola M, MD 2/17/20154:03 PM

## 2013-09-06 ENCOUNTER — Telehealth: Payer: Self-pay | Admitting: *Deleted

## 2013-09-06 NOTE — Telephone Encounter (Signed)
Pt was informed of ferritin result & OK to watch per Dr. Cyndie ChimeGranfortuna.

## 2013-09-06 NOTE — Telephone Encounter (Signed)
Message copied by Sabino SnipesHARDIN, Klinton Candelas on Wed Sep 06, 2013  5:15 PM ------      Message from: Levert FeinsteinGRANFORTUNA, JAMES M      Created: Wed Sep 06, 2013  1:22 PM       Call pt: ferritin 108 - this is OK just to watch ------

## 2013-09-17 ENCOUNTER — Encounter: Payer: Self-pay | Admitting: Oncology

## 2015-04-15 ENCOUNTER — Encounter: Payer: Self-pay | Admitting: Oncology

## 2015-04-23 ENCOUNTER — Ambulatory Visit (INDEPENDENT_AMBULATORY_CARE_PROVIDER_SITE_OTHER): Payer: BLUE CROSS/BLUE SHIELD | Admitting: Oncology

## 2015-04-23 ENCOUNTER — Encounter: Payer: Self-pay | Admitting: Oncology

## 2015-04-23 NOTE — Patient Instructions (Signed)
To lab today repeat lab in 6 months & 12 months MD visit in 12 months 1-2 weeks after labs

## 2015-04-24 ENCOUNTER — Telehealth: Payer: Self-pay | Admitting: *Deleted

## 2015-04-24 ENCOUNTER — Other Ambulatory Visit: Payer: Self-pay | Admitting: Oncology

## 2015-04-24 DIAGNOSIS — D7589 Other specified diseases of blood and blood-forming organs: Secondary | ICD-10-CM

## 2015-04-24 LAB — COMPREHENSIVE METABOLIC PANEL
ALK PHOS: 104 IU/L (ref 39–117)
ALT: 11 IU/L (ref 0–32)
AST: 11 IU/L (ref 0–40)
Albumin/Globulin Ratio: 1.7 (ref 1.1–2.5)
Albumin: 4.3 g/dL (ref 3.5–5.5)
BUN/Creatinine Ratio: 19 (ref 9–23)
BUN: 15 mg/dL (ref 6–24)
Bilirubin Total: 0.3 mg/dL (ref 0.0–1.2)
CALCIUM: 9.6 mg/dL (ref 8.7–10.2)
CO2: 27 mmol/L (ref 18–29)
CREATININE: 0.79 mg/dL (ref 0.57–1.00)
Chloride: 96 mmol/L — ABNORMAL LOW (ref 97–108)
GFR calc non Af Amer: 87 mL/min/{1.73_m2} (ref >59)
GFR, EST AFRICAN AMERICAN: 100 mL/min/{1.73_m2} (ref >59)
GLOBULIN, TOTAL: 2.5 g/dL (ref 1.5–4.5)
Glucose: 72 mg/dL (ref 65–99)
Potassium: 5.1 mmol/L (ref 3.5–5.2)
SODIUM: 141 mmol/L (ref 134–144)
Total Protein: 6.8 g/dL (ref 6.0–8.5)

## 2015-04-24 LAB — CBC WITH DIFFERENTIAL/PLATELET
BASOS ABS: 0.1 10*3/uL (ref 0.0–0.2)
Basos: 1 %
EOS (ABSOLUTE): 0.7 10*3/uL — ABNORMAL HIGH (ref 0.0–0.4)
Eos: 10 %
Hematocrit: 40.4 % (ref 34.0–46.6)
Hemoglobin: 12.9 g/dL (ref 11.1–15.9)
Immature Grans (Abs): 0 10*3/uL (ref 0.0–0.1)
Immature Granulocytes: 0 %
LYMPHS ABS: 3.7 10*3/uL — AB (ref 0.7–3.1)
Lymphs: 48 %
MCH: 32.1 pg (ref 26.6–33.0)
MCHC: 31.9 g/dL (ref 31.5–35.7)
MCV: 101 fL — ABNORMAL HIGH (ref 79–97)
MONOS ABS: 0.6 10*3/uL (ref 0.1–0.9)
Monocytes: 8 %
Neutrophils Absolute: 2.5 10*3/uL (ref 1.4–7.0)
Neutrophils: 33 %
PLATELETS: 230 10*3/uL (ref 150–379)
RBC: 4.02 x10E6/uL (ref 3.77–5.28)
RDW: 12.4 % (ref 12.3–15.4)
WBC: 7.6 10*3/uL (ref 3.4–10.8)

## 2015-04-24 LAB — FERRITIN: Ferritin: 37 ng/mL (ref 15–150)

## 2015-04-24 NOTE — Telephone Encounter (Signed)
Pt called - no answer; left message "Ferritin levels remain low; liver tests normal; red blood count stable: per Dr Cyndie Chime. And to call if she has any questions.

## 2015-04-24 NOTE — Progress Notes (Signed)
Patient ID: Chelsea Alexander, female   DOB: 1966/04/03, 49 y.o.   MRN: [id number] Hematology and Oncology Follow Up Visit  Maraki Macquarrie [id number] 1965/12/15 49 y.o. 04/24/2015 9:49 AM   Principle Diagnosis: Encounter Diagnosis  Name Primary?  . 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 Despite this, she consistently has ferritin levels less than 100 and has not required phlebotomy. Her mother, 2 sisters, and a maternal uncle also affected. Uncle had the disease and ultimately required a liver transplant and died of complications. The mother and one of the other sisters also homozygotes and have not required phlebotomy. One sister requires intermittent phlebotomy. 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 She has seen multiple specialists without any unifying diagnosis being established.  An extensive panel of additional laboratory studies to look for potential infectious etiologies including Lyme disease, babesiosis, and Bartonella all of which were negative. One physician treated her empirically for Lyme disease. She checked C4 complement levels which were, if anything, elevated. She has not felt any  amelioration of her symptoms with antibiotic and other adjunctive therapy for Lyme disease.  Interim History:   She has not seen me since February 2015. She is learning to live with her symptoms. She has some good days when she tries to do as much as she can. She has 3 children and stays busy. Some days she just has to stay in bed. She has developed no new or progressive symptoms. Ferritin level done today remains low at 37. Liver functions normal.  Medications: reviewed  Allergies:  Allergies  Allergen Reactions  . Compazine Anaphylaxis  . Sulfa Antibiotics Anaphylaxis  . Amoxicillin Hives  . Ciprofloxacin Other (See Comments)   jittery  . Levofloxacin Other (See Comments)    Muscle rigidity  . Tetracyclines & Related Hives  . Diphenhydramine Hcl   . Morphine And Related Other (See Comments)    Crawls out of skin, no pain relief    Review of Systems: See HPI Remaining ROS negative:   Physical Exam: Blood pressure 103/68, pulse 76, temperature 97.5 F (36.4 C), temperature source Oral, height  (1.575 m), weight 131 lb 9.6 oz (59.693 kg), SpO2 100 %. Wt Readings from Last 3 Encounters:  04/23/15 131 lb 9.6 oz (59.693 kg)  09/05/13 129 lb 11.2 oz (58.832 kg)  08/31/13 126 lb (57.153 kg)     General appearance: well nourished caucasian woman HENNT: Pharynx no erythema, exudate, mass, or ulcer. No thyromegaly or thyroid nodules Lymph nodes: No cervical, supraclavicular, or axillary lymphadenopathy Breasts:  Lungs: Clear to auscultation, resonant to percussion throughout Heart: Regular rhythm, no murmur, no gallop, no rub, no click, no edema Abdomen: Soft, nontender, normal bowel sounds, no mass, no organomegaly Extremities: No edema, no calf tenderness Musculoskeletal: no joint deformities GU:  Vascular: Carotid pulses 2+, no bruits, Neurologic: Alert, oriented, PERRLA,  cranial nerves grossly normal, motor strength 5 over 5, reflexes 1+ symmetric, upper body coordination normal, gait normal, Skin: No rash or ecchymosis  Lab Results: CBC W/Diff    Component Value Date/Time   WBC 7.6 04/23/2015 1056   WBC 5.9 09/05/2013 1208   WBC 7.2 08/31/2013 0445   RBC 4.02 04/23/2015 1056   RBC 4.24 09/05/2013 1208   RBC 3.85* 08/31/2013 0445   HGB 13.2 09/05/2013 1208   HGB 12.2 08/31/2013 0445   HCT 40.4 04/23/2015 1056   HCT  39.1 09/05/2013 1208   HCT 35.6* 08/31/2013 0445   PLT 250 09/05/2013 1208   PLT 172 08/31/2013 0445   MCV 92.2 09/05/2013 1208   MCV 92.5 08/31/2013 0445   MCH 32.1 04/23/2015 1056   MCH 31.1 09/05/2013 1208   MCH 31.7 08/31/2013 0445   MCHC 31.9 04/23/2015 1056   MCHC  33.8 09/05/2013 1208   MCHC 34.3 08/31/2013 0445   RDW 12.4 04/23/2015 1056   RDW 12.1 09/05/2013 1208   RDW 12.1 08/31/2013 0445   LYMPHSABS 3.7* 04/23/2015 1056   LYMPHSABS 2.6 09/05/2013 1208   LYMPHSABS 1.5 09/24/2010 2236   MONOABS 0.5 09/05/2013 1208   MONOABS 0.3 09/24/2010 2236   EOSABS 0.5 09/05/2013 1208   EOSABS 0.0 09/24/2010 2236   BASOSABS 0.1 04/23/2015 1056   BASOSABS 0.0 09/05/2013 1208   BASOSABS 0.0 09/24/2010 2236     Chemistry      Component Value Date/Time   NA 141 04/23/2015 1056   NA 138 09/05/2013 1208   NA 141 08/30/2012 1054   K 5.1 04/23/2015 1056   K 3.8 08/30/2012 1054   CL 96* 04/23/2015 1056   CL 101 08/30/2012 1054   CO2 27 04/23/2015 1056   CO2 32* 08/30/2012 1054   BUN 15 04/23/2015 1056   BUN 7 09/05/2013 1208   BUN 13.1 08/30/2012 1054   CREATININE 0.79 04/23/2015 1056   CREATININE 0.8 08/30/2012 1054      Component Value Date/Time   CALCIUM 9.6 04/23/2015 1056   CALCIUM 9.9 08/30/2012 1054   ALKPHOS 104 04/23/2015 1056   ALKPHOS 106 08/30/2012 1054   AST 11 04/23/2015 1056   AST 22 08/30/2012 1054   ALT 11 04/23/2015 1056   ALT 20 08/30/2012 1054   BILITOT 0.3 04/23/2015 1056   BILITOT 0.3 09/05/2013 1208   BILITOT 0.30 08/30/2012 1054       Radiological Studies: No results found.  Impression:  #1. 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 Liver functions remain normal. Ferritin levels remained low and she does not require phlebotomy. I will continue to monitor her on an annual basis.  #2. 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   CC: Patient Care Team: Teena Irani, PA-C as Physician Assistant (Physician Assistant) Levert Feinstein, MD as Consulting Physician (Oncology)   Levert Feinstein, MD 10/5/20169:49 AM

## 2015-04-24 NOTE — Telephone Encounter (Signed)
-----   Message from Levert Feinstein, MD sent at 04/24/2015 10:01 AM EDT ----- Call pt: ferritin levels remain low. Liver tests normal. Red blood count stable

## 2015-10-22 ENCOUNTER — Other Ambulatory Visit: Payer: BLUE CROSS/BLUE SHIELD

## 2015-10-22 ENCOUNTER — Encounter: Payer: Self-pay | Admitting: *Deleted

## 2015-11-06 ENCOUNTER — Other Ambulatory Visit: Payer: BLUE CROSS/BLUE SHIELD

## 2015-11-11 ENCOUNTER — Telehealth: Payer: Self-pay | Admitting: Oncology

## 2015-11-11 ENCOUNTER — Other Ambulatory Visit: Payer: Self-pay | Admitting: Oncology

## 2015-11-11 NOTE — Telephone Encounter (Signed)
APPT. REMINDER CALL, LMTCB °

## 2015-11-12 ENCOUNTER — Other Ambulatory Visit: Payer: BLUE CROSS/BLUE SHIELD

## 2015-11-18 ENCOUNTER — Telehealth: Payer: Self-pay | Admitting: Oncology

## 2015-11-18 NOTE — Telephone Encounter (Signed)
APPT. REMINDER CALL, LMTCB °

## 2015-11-20 ENCOUNTER — Other Ambulatory Visit: Payer: BLUE CROSS/BLUE SHIELD

## 2016-04-22 ENCOUNTER — Telehealth: Payer: Self-pay

## 2016-04-22 NOTE — Telephone Encounter (Signed)
AP. REMINDER  CALL, LMTCB °

## 2016-04-23 ENCOUNTER — Other Ambulatory Visit (INDEPENDENT_AMBULATORY_CARE_PROVIDER_SITE_OTHER): Payer: BLUE CROSS/BLUE SHIELD

## 2016-04-24 LAB — CBC WITH DIFFERENTIAL/PLATELET
Basophils Absolute: 0 10*3/uL (ref 0.0–0.2)
Basos: 1 %
EOS (ABSOLUTE): 0.3 10*3/uL (ref 0.0–0.4)
Eos: 5 %
Hematocrit: 37.6 % (ref 34.0–46.6)
Hemoglobin: 12.8 g/dL (ref 11.1–15.9)
Immature Grans (Abs): 0 10*3/uL (ref 0.0–0.1)
Immature Granulocytes: 0 %
Lymphocytes Absolute: 3.4 10*3/uL — ABNORMAL HIGH (ref 0.7–3.1)
Lymphs: 53 %
MCH: 32.1 pg (ref 26.6–33.0)
MCHC: 34 g/dL (ref 31.5–35.7)
MCV: 94 fL (ref 79–97)
Monocytes Absolute: 0.5 10*3/uL (ref 0.1–0.9)
Monocytes: 8 %
Neutrophils Absolute: 2.1 10*3/uL (ref 1.4–7.0)
Neutrophils: 33 %
Platelets: 284 10*3/uL (ref 150–379)
RBC: 3.99 x10E6/uL (ref 3.77–5.28)
RDW: 13.6 % (ref 12.3–15.4)
WBC: 6.4 10*3/uL (ref 3.4–10.8)

## 2016-04-24 LAB — COMPREHENSIVE METABOLIC PANEL
ALK PHOS: 82 IU/L (ref 39–117)
ALT: 15 IU/L (ref 0–32)
AST: 13 IU/L (ref 0–40)
Albumin/Globulin Ratio: 1.8 (ref 1.2–2.2)
Albumin: 4.2 g/dL (ref 3.5–5.5)
BILIRUBIN TOTAL: 0.4 mg/dL (ref 0.0–1.2)
BUN/Creatinine Ratio: 21 (ref 9–23)
BUN: 15 mg/dL (ref 6–24)
CHLORIDE: 97 mmol/L (ref 96–106)
CO2: 26 mmol/L (ref 18–29)
CREATININE: 0.7 mg/dL (ref 0.57–1.00)
Calcium: 9.7 mg/dL (ref 8.7–10.2)
GFR calc Af Amer: 115 mL/min/{1.73_m2} (ref >59)
GFR calc non Af Amer: 100 mL/min/{1.73_m2} (ref >59)
GLUCOSE: 89 mg/dL (ref 65–99)
Globulin, Total: 2.4 g/dL (ref 1.5–4.5)
Potassium: 4.7 mmol/L (ref 3.5–5.2)
Sodium: 140 mmol/L (ref 134–144)
Total Protein: 6.6 g/dL (ref 6.0–8.5)

## 2016-04-24 LAB — FERRITIN: Ferritin: 57 ng/mL (ref 15–150)

## 2016-04-27 ENCOUNTER — Telehealth: Payer: Self-pay | Admitting: *Deleted

## 2016-04-27 NOTE — Telephone Encounter (Signed)
-----   Message from Levert FeinsteinJames M Granfortuna, MD sent at 04/24/2016 11:57 AM EDT ----- Call pt: ferritin good at 57; Hb & liver tests normal

## 2016-04-27 NOTE — Telephone Encounter (Signed)
Pt called / informed "ferritin good at 57; Hb & liver tests normal" per Dr Cyndie ChimeGranfortuna.. Stated "great'. Wants to know should she keep her appt next Monday since labs are good? Thanks

## 2016-04-29 ENCOUNTER — Other Ambulatory Visit: Payer: Self-pay | Admitting: Oncology

## 2016-04-29 NOTE — Telephone Encounter (Signed)
Pt called - no answer; left message "She can skip visit - check ferritin again in 6 months" per Dr Cyndie ChimeGranfortuna. And call us back to schedule lab appt.

## 2016-05-04 ENCOUNTER — Ambulatory Visit: Payer: BLUE CROSS/BLUE SHIELD | Admitting: Oncology

## 2018-04-20 ENCOUNTER — Other Ambulatory Visit: Payer: Self-pay | Admitting: Oncology

## 2018-04-21 ENCOUNTER — Other Ambulatory Visit: Payer: Self-pay | Admitting: Oncology

## 2018-05-23 ENCOUNTER — Other Ambulatory Visit (INDEPENDENT_AMBULATORY_CARE_PROVIDER_SITE_OTHER): Payer: BLUE CROSS/BLUE SHIELD

## 2018-05-24 LAB — CBC WITH DIFFERENTIAL/PLATELET
BASOS ABS: 0.1 10*3/uL (ref 0.0–0.2)
Basos: 1 %
EOS (ABSOLUTE): 0.4 10*3/uL (ref 0.0–0.4)
Eos: 6 %
Hematocrit: 41.1 % (ref 34.0–46.6)
Hemoglobin: 14 g/dL (ref 11.1–15.9)
IMMATURE GRANS (ABS): 0 10*3/uL (ref 0.0–0.1)
IMMATURE GRANULOCYTES: 0 %
LYMPHS: 36 %
Lymphocytes Absolute: 2.3 10*3/uL (ref 0.7–3.1)
MCH: 31.8 pg (ref 26.6–33.0)
MCHC: 34.1 g/dL (ref 31.5–35.7)
MCV: 93 fL (ref 79–97)
Monocytes Absolute: 0.5 10*3/uL (ref 0.1–0.9)
Monocytes: 8 %
NEUTROS PCT: 49 %
Neutrophils Absolute: 3 10*3/uL (ref 1.4–7.0)
PLATELETS: 240 10*3/uL (ref 150–450)
RBC: 4.4 x10E6/uL (ref 3.77–5.28)
RDW: 11.8 % — ABNORMAL LOW (ref 12.3–15.4)
WBC: 6.2 10*3/uL (ref 3.4–10.8)

## 2018-05-24 LAB — COMPREHENSIVE METABOLIC PANEL
A/G RATIO: 1.8 (ref 1.2–2.2)
ALT: 44 IU/L — AB (ref 0–32)
AST: 35 IU/L (ref 0–40)
Albumin: 4.4 g/dL (ref 3.5–5.5)
Alkaline Phosphatase: 56 IU/L (ref 39–117)
BUN/Creatinine Ratio: 13 (ref 9–23)
BUN: 11 mg/dL (ref 6–24)
Bilirubin Total: 0.2 mg/dL (ref 0.0–1.2)
CALCIUM: 9.8 mg/dL (ref 8.7–10.2)
CHLORIDE: 99 mmol/L (ref 96–106)
CO2: 24 mmol/L (ref 20–29)
Creatinine, Ser: 0.84 mg/dL (ref 0.57–1.00)
GFR calc Af Amer: 91 mL/min/{1.73_m2} (ref >59)
GFR, EST NON AFRICAN AMERICAN: 79 mL/min/{1.73_m2} (ref >59)
GLUCOSE: 123 mg/dL — AB (ref 65–99)
Globulin, Total: 2.5 g/dL (ref 1.5–4.5)
POTASSIUM: 4.3 mmol/L (ref 3.5–5.2)
Sodium: 140 mmol/L (ref 134–144)
Total Protein: 6.9 g/dL (ref 6.0–8.5)

## 2018-05-24 LAB — FERRITIN: FERRITIN: 64 ng/mL (ref 15–150)

## 2018-05-30 ENCOUNTER — Encounter: Payer: BLUE CROSS/BLUE SHIELD | Admitting: Oncology

## 2018-06-01 ENCOUNTER — Telehealth: Payer: Self-pay | Admitting: *Deleted

## 2018-06-01 NOTE — Telephone Encounter (Signed)
-----   Message from Levert FeinsteinJames M Granfortuna, MD sent at 05/31/2018  4:32 PM EST ----- Call pt: ferritin remains in good range at 64. We will forward all results to your primary care MD.

## 2018-06-01 NOTE — Telephone Encounter (Signed)
Called pt - no answer; left message to give me a call back. 

## 2018-06-02 NOTE — Telephone Encounter (Signed)
-----   Message from James M Granfortuna, MD sent at 05/31/2018  4:32 PM EST ----- Call pt: ferritin remains in good range at 64. We will forward all results to your primary care MD. 

## 2018-06-02 NOTE — Telephone Encounter (Signed)
Pt called / informed "ferritin remains in good range at 64. We will forward all results to your primary care MD. " per Dr Cyndie ChimeGranfortuna. She missed her last appt; she was sick - she would to be re-scheduled; I will send message to Mickle Plumboris S.

## 2018-06-02 NOTE — Telephone Encounter (Signed)
OK - thanks

## 2018-06-20 ENCOUNTER — Other Ambulatory Visit: Payer: Self-pay | Admitting: Oncology

## 2018-08-22 ENCOUNTER — Other Ambulatory Visit: Payer: BLUE CROSS/BLUE SHIELD

## 2018-08-29 ENCOUNTER — Other Ambulatory Visit: Payer: BLUE CROSS/BLUE SHIELD

## 2018-08-31 ENCOUNTER — Other Ambulatory Visit: Payer: BLUE CROSS/BLUE SHIELD

## 2018-09-05 ENCOUNTER — Encounter: Payer: BLUE CROSS/BLUE SHIELD | Admitting: Oncology

## 2018-09-13 ENCOUNTER — Telehealth: Payer: Self-pay | Admitting: Hematology

## 2018-09-13 ENCOUNTER — Encounter: Payer: Self-pay | Admitting: Hematology

## 2018-09-13 NOTE — Telephone Encounter (Signed)
A new hem appt has been scheduled for the pt to see Dr. Mosetta Putt on 5/4 at 10:15am w/labs at 9:45am. Letter mailed.

## 2018-09-28 ENCOUNTER — Other Ambulatory Visit: Payer: BLUE CROSS/BLUE SHIELD

## 2018-10-11 ENCOUNTER — Encounter: Payer: BLUE CROSS/BLUE SHIELD | Admitting: Oncology

## 2018-11-17 ENCOUNTER — Other Ambulatory Visit: Payer: Self-pay

## 2018-11-17 DIAGNOSIS — D500 Iron deficiency anemia secondary to blood loss (chronic): Secondary | ICD-10-CM

## 2018-11-18 NOTE — Progress Notes (Signed)
[facility]   [phone] [phone]   Clinic New Consult Note   Patient Care Team: Eartha Inch, MD as PCP - General (Family Medicine) Lucila Maine as Physician Assistant (Physician Assistant) Levert Feinstein, MD as Consulting Physician (Oncology)  Date of Service:  11/21/2018   CHIEF COMPLAINTS/PURPOSE OF CONSULTATION:  Hereditary hemochromatosis  REFERRING PHYSICIAN:  Dr. Cyndie Chime  HISTORY OF PRESENTING ILLNESS:  Chelsea Alexander 53 y.o. female is a here because of  Hereditary hemochromatosis. The patient was previously being seen by Dr. Gaspar Bidding and has transferred her care to me due to Dr. Timoteo Expose retirment. The patient presents to the clinic by herself.    She notes she was diagnosed in the last 7 years and started to see Dr. Frederich Chick. Her sister and mother's side of her family have this. She notes she does have children and had them tested. Results were negative. She has not required phlebotomy often, she only had it once. She has not been tested in a while.   Socially she has 3 children and married. She does not smoke or drink. She notes she has fibromyalgia with fatigue and macule pain. She notes more pain if she does not sleep well. She has had this for the last 8-9 years. She has not been doing mammograms since 05/2013 given pain from fibromyalgia.  She notes her maternal uncle has prostate cancer. Her father had lung cancer s/p surgery, he was a smoker.    REVIEW OF SYSTEMS:   Constitutional: Denies fevers, chills or abnormal night sweats Eyes: Denies blurriness of vision, double vision or watery eyes Ears, nose, mouth, throat, and face: Denies mucositis or sore throat Respiratory: Denies cough, dyspnea or wheezes Cardiovascular: Denies palpitation, chest discomfort or lower extremity swelling Gastrointestinal:  Denies nausea, heartburn or change in bowel habits Skin: Denies abnormal skin rashes MSK: (+) Fibromyalgia  with fatigue and muscle pain  Lymphatics: Denies new lymphadenopathy or easy bruising Neurological:Denies numbness, tingling or new weaknesses Behavioral/Psych: Mood is stable, no new changes  All other systems were reviewed with the patient and are negative.  MEDICAL HISTORY:  Past Medical History:  Diagnosis Date  . Anxiety   . Asthma   . Attention deficit disorder    without hyperactivity  . Chronic low back pain   . Fatigue   . Generalized subcutaneous nodules 08/14/2011  . GERD (gastroesophageal reflux disease)   . Hemochromatosis, hereditary (HCC)   . Lyme disease    tx in [place]  . Muscle weakness   . Palpitations   . Tremor     SURGICAL HISTORY: Past Surgical History:  Procedure Laterality Date  . BREAST ENHANCEMENT SURGERY  1991  . CESAREAN SECTION  2004  . CHOLECYSTECTOMY  2011  . NASAL SINUS SURGERY  2000    SOCIAL HISTORY: Social History   Socioeconomic History  . Marital status: Married    Spouse name: Not on file  . Number of children: 3  . Years of education: Not on file  . Highest education level: Not on file  Occupational History  . Occupation: housewife  Social Needs  . Financial resource strain: Not on file  . Food insecurity:    Worry: Not on file    Inability: Not on file  . Transportation needs:    Medical: Not on file    Non-medical: Not on file  Tobacco Use  . Smoking status: Never Smoker  . Smokeless tobacco: Never Used  Substance and Sexual  Activity  . Alcohol use: No    Alcohol/week: 0.0 standard drinks  . Drug use: No  . Sexual activity: Not on file  Lifestyle  . Physical activity:    Days per week: Not on file    Minutes per session: Not on file  . Stress: Not on file  Relationships  . Social connections:    Talks on phone: Not on file    Gets together: Not on file    Attends religious service: Not on file    Active member of club or organization: Not on file    Attends meetings of clubs or organizations: Not on file     Relationship status: Not on file  . Intimate partner violence:    Fear of current or ex partner: Not on file    Emotionally abused: Not on file    Physically abused: Not on file    Forced sexual activity: Not on file  Other Topics Concern  . Not on file  Social History Narrative  . Not on file    FAMILY HISTORY: Family History  Problem Relation Age of Onset  . Hemochromatosis Mother   . Cancer Father        lung cancer  . Hemochromatosis Sister   . Hemochromatosis Maternal Aunt   . Cancer Maternal Uncle        prostate cancer  . Hemochromatosis Maternal Uncle   . Hemochromatosis Maternal Uncle     ALLERGIES:  is allergic to compazine; sulfa antibiotics; amoxicillin; ciprofloxacin; levofloxacin; tetracyclines & related; diphenhydramine hcl; and morphine and related.  MEDICATIONS:  Current Outpatient Medications  Medication Sig Dispense Refill  . ALPRAZolam (XANAX) 0.25 MG tablet Take 0.5 mg by mouth 3 (three) times daily as needed.     . desvenlafaxine (PRISTIQ) 50 MG 24 hr tablet Take 50 mg by mouth daily.    Marland Kitchen EPINEPHrine (EPIPEN JR) 0.15 MG/0.3ML injection Inject 0.15 mg into the muscle as needed.      . ergocalciferol (VITAMIN D2) 1.25 MG (50000 UT) capsule Take 50,000 Units by mouth once a week.    Marland Kitchen HYDROmorphone (DILAUDID) 4 MG tablet Take by mouth every 4 (four) hours as needed for severe pain (and at bedtime).    Marland Kitchen levalbuterol (XOPENEX HFA) 45 MCG/ACT inhaler Inhale 2 puffs into the lungs every 4 (four) hours as needed.       No current facility-administered medications for this visit.     PHYSICAL EXAMINATION: ECOG PERFORMANCE STATUS: 1 - Symptomatic but completely ambulatory  Vitals:   11/21/18 1033  BP: 113/86  Pulse: (!) 105  Resp: 17  Temp: 98.1 F (36.7 C)  SpO2: 97%   Filed Weights   11/21/18 1033  Weight: 164 lb 4.8 oz (74.5 kg)    GENERAL:alert, no distress and comfortable SKIN: skin color, texture, turgor are normal, no rashes or  significant lesions EYES: normal, conjunctiva are pink and non-injected, sclera clear OROPHARYNX:no exudate, no erythema and lips, buccal mucosa, and tongue normal  NECK: supple, thyroid normal size, non-tender, without nodularity LYMPH:  no palpable lymphadenopathy in the cervical, axillary or inguinal LUNGS: clear to auscultation and percussion with normal breathing effort HEART: regular rate & rhythm and no murmurs and no lower extremity edema ABDOMEN:abdomen soft, non-tender and normal bowel sounds Musculoskeletal:no cyanosis of digits and no clubbing  PSYCH: alert & oriented x 3 with fluent speech NEURO: no focal motor/sensory deficits  LABORATORY DATA:  I have reviewed the data as listed CBC Latest Ref  Rng & Units 11/21/2018 05/23/2018 04/23/2016  WBC 4.0 - 10.5 K/uL 7.7 6.2 6.4  Hemoglobin 12.0 - 15.0 g/dL 33.8 32.9 19.1  Hematocrit 36.0 - 46.0 % 43.5 41.1 37.6  Platelets 150 - 400 K/uL 255 240 284    CMP Latest Ref Rng & Units 11/21/2018 05/23/2018 04/23/2016  Glucose 70 - 99 mg/dL 660(A) 004(H) 89  BUN 6 - 20 mg/dL 14 11 15   Creatinine 0.44 - 1.00 mg/dL 9.97 7.41 4.23  Sodium 135 - 145 mmol/L 143 140 140  Potassium 3.5 - 5.1 mmol/L 4.3 4.3 4.7  Chloride 98 - 111 mmol/L 103 99 97  CO2 22 - 32 mmol/L 29 24 26   Calcium 8.9 - 10.3 mg/dL 95.3 9.8 9.7  Total Protein 6.5 - 8.1 g/dL 7.5 6.9 6.6  Total Bilirubin 0.3 - 1.2 mg/dL 0.4 0.2 0.4  Alkaline Phos 38 - 126 U/L 61 56 82  AST 15 - 41 U/L 17 35 13  ALT 0 - 44 U/L 19 44(H) 15   Results for LACIANA, PICKER (MRN [id number]) as of 11/21/2018 22:21  Ref. Range 09/05/2013 12:08 04/23/2015 10:56 04/23/2016 10:51 05/23/2018 10:47 11/21/2018 09:58  Iron Latest Ref Range: 41 - 142 ug/dL     861 (H)  UIBC Latest Ref Range: 120 - 384 ug/dL     97 (L)  TIBC Latest Ref Range: 236 - 444 ug/dL     683  Saturation Ratios Latest Ref Range: 21 - 57 %     65 (H)  Ferritin Latest Ref Range: 11 - 307 ng/mL 108 37 57 64 43    RADIOGRAPHIC STUDIES: I have  personally reviewed the radiological images as listed and agreed with the findings in the report. No results found.  ASSESSMENT & PLAN:  Katlyn Landuyt is a 53 y.o. Caucasian female with a history of Anxiety, Asthma, ADD, GERD, Fibromyalgia   1. 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  -She was diagnosed in her 80's. Her sister and mother's side of her family have been diagnosed as well. Her children was tested and all negative.   -I discussed hemochromatosis can cause damage to ogans, especially liver, and also cause heart failure, muscle and nerve damage, diabetes and if uncontrolled due to iron overload.  -She consistently has ferritin levels less than 100 and has only required phlebotomy a few times   -New goal of Ferritin<50 ng/ml and transferrin saturation <50%, through Phlebotomy.  -She is clinically doing well. Physical exam unremarkable, no organomegaly. Labs reviewed, CBC, CMP are WNL. Ferritin today 43, iron elevated at 177, and saturation 65%.  -Will schedule Phlebotomy next week.  -will watch if her neuro muscular symptoms improve with more strick iron control  -I encouraged her to avoid iron supplement and reduce red meat in diet. She can use chicken or fish for protein.  -Will do US liver to monitor for Liver Cirrhosis every 3-5 years.  -f/u in 6 months   2. Fibromyalgia  -With fatigue and muscle pain  -Currently controlled.   3. Cancer Screenings -She has not had mammogram since 05/2013 due to fibromyalgia pain. I encouraged her to take Tylenol before screening to help with pain. She is interested.  -She notes she has saline implants with scar tissue in which has needed to be changes occasionally. She plans to change this year after COVID-19. I will refer her to plastic surgeon in [place] as she should change them every 10 years.  -  She had a colonoscopy and endoscopy which was normal before her cholecystectomy  with Dr. Lottie MusselMadoff 7 years ago. I encouraged her to follow up with him on when to return.  -She will do US liver periodically to monitor for liver cirrhosis.   3. Anxiety, depression  -On Prestiq  -Currently well managed   PLAN:  F/u in 6 months with lab and abd US one week before  Phlebotomy in a week Lab in 3 months, will schedule phlebotomy if ferritin more than 50, or saturation more than 50%    No orders of the defined types were placed in this encounter.   All questions were answered. The patient knows to call the clinic with any problems, questions or concerns. I spent 30 minutes counseling the patient face to face. The total time spent in the appointment was 40 minutes and more than 50% was on counseling.     Malachy MoodYan Khyre Germond, MD 11/21/2018 10:19 PM  I, Delphina CahillAmoya Bennett, am acting as scribe for Malachy MoodYan Rainn Zupko, MD.   I have reviewed the above documentation for accuracy and completeness, and I agree with the above.

## 2018-11-20 ENCOUNTER — Other Ambulatory Visit: Payer: Self-pay | Admitting: Hematology

## 2018-11-20 DIAGNOSIS — D500 Iron deficiency anemia secondary to blood loss (chronic): Secondary | ICD-10-CM

## 2018-11-21 ENCOUNTER — Inpatient Hospital Stay: Payer: BLUE CROSS/BLUE SHIELD | Attending: Hematology | Admitting: Hematology

## 2018-11-21 ENCOUNTER — Encounter: Payer: Self-pay | Admitting: Hematology

## 2018-11-21 ENCOUNTER — Inpatient Hospital Stay: Payer: BLUE CROSS/BLUE SHIELD

## 2018-11-21 ENCOUNTER — Other Ambulatory Visit: Payer: Self-pay

## 2018-11-21 ENCOUNTER — Telehealth: Payer: Self-pay | Admitting: Hematology

## 2018-11-21 DIAGNOSIS — F418 Other specified anxiety disorders: Secondary | ICD-10-CM | POA: Insufficient documentation

## 2018-11-21 DIAGNOSIS — M797 Fibromyalgia: Secondary | ICD-10-CM | POA: Diagnosis not present

## 2018-11-21 DIAGNOSIS — K219 Gastro-esophageal reflux disease without esophagitis: Secondary | ICD-10-CM | POA: Diagnosis not present

## 2018-11-21 DIAGNOSIS — Z801 Family history of malignant neoplasm of trachea, bronchus and lung: Secondary | ICD-10-CM | POA: Diagnosis not present

## 2018-11-21 DIAGNOSIS — Z8042 Family history of malignant neoplasm of prostate: Secondary | ICD-10-CM | POA: Insufficient documentation

## 2018-11-21 DIAGNOSIS — R5383 Other fatigue: Secondary | ICD-10-CM | POA: Insufficient documentation

## 2018-11-21 DIAGNOSIS — Z79899 Other long term (current) drug therapy: Secondary | ICD-10-CM | POA: Diagnosis not present

## 2018-11-21 DIAGNOSIS — J45909 Unspecified asthma, uncomplicated: Secondary | ICD-10-CM | POA: Insufficient documentation

## 2018-11-21 DIAGNOSIS — D500 Iron deficiency anemia secondary to blood loss (chronic): Secondary | ICD-10-CM

## 2018-11-21 LAB — CBC WITH DIFFERENTIAL (CANCER CENTER ONLY)
Abs Immature Granulocytes: 0.02 10*3/uL (ref 0.00–0.07)
Basophils Absolute: 0.1 10*3/uL (ref 0.0–0.1)
Basophils Relative: 1 %
Eosinophils Absolute: 0.4 10*3/uL (ref 0.0–0.5)
Eosinophils Relative: 6 %
HCT: 43.5 % (ref 36.0–46.0)
Hemoglobin: 14.8 g/dL (ref 12.0–15.0)
Immature Granulocytes: 0 %
Lymphocytes Relative: 34 %
Lymphs Abs: 2.6 10*3/uL (ref 0.7–4.0)
MCH: 32.1 pg (ref 26.0–34.0)
MCHC: 34 g/dL (ref 30.0–36.0)
MCV: 94.4 fL (ref 80.0–100.0)
Monocytes Absolute: 0.5 10*3/uL (ref 0.1–1.0)
Monocytes Relative: 6 %
Neutro Abs: 4.1 10*3/uL (ref 1.7–7.7)
Neutrophils Relative %: 53 %
Platelet Count: 255 10*3/uL (ref 150–400)
RBC: 4.61 MIL/uL (ref 3.87–5.11)
RDW: 12 % (ref 11.5–15.5)
WBC Count: 7.7 10*3/uL (ref 4.0–10.5)
nRBC: 0 % (ref 0.0–0.2)

## 2018-11-21 LAB — CMP (CANCER CENTER ONLY)
ALT: 19 U/L (ref 0–44)
AST: 17 U/L (ref 15–41)
Albumin: 4.3 g/dL (ref 3.5–5.0)
Alkaline Phosphatase: 61 U/L (ref 38–126)
Anion gap: 11 (ref 5–15)
BUN: 14 mg/dL (ref 6–20)
CO2: 29 mmol/L (ref 22–32)
Calcium: 10 mg/dL (ref 8.9–10.3)
Chloride: 103 mmol/L (ref 98–111)
Creatinine: 0.83 mg/dL (ref 0.44–1.00)
GFR, Est AFR Am: >60 mL/min (ref >60)
GFR, Estimated: >60 mL/min (ref >60)
Glucose, Bld: 129 mg/dL — ABNORMAL HIGH (ref 70–99)
Potassium: 4.3 mmol/L (ref 3.5–5.1)
Sodium: 143 mmol/L (ref 135–145)
Total Bilirubin: 0.4 mg/dL (ref 0.3–1.2)
Total Protein: 7.5 g/dL (ref 6.5–8.1)

## 2018-11-21 LAB — IRON AND TIBC
Iron: 177 ug/dL — ABNORMAL HIGH (ref 41–142)
Saturation Ratios: 65 % — ABNORMAL HIGH (ref 21–57)
TIBC: 274 ug/dL (ref 236–444)
UIBC: 97 ug/dL — ABNORMAL LOW (ref 120–384)

## 2018-11-21 LAB — FERRITIN: Ferritin: 43 ng/mL (ref 11–307)

## 2018-11-21 NOTE — Telephone Encounter (Signed)
Scheduled appt per 5/4 los. ° °Left a VM of scheduled appt. °

## 2018-11-28 ENCOUNTER — Telehealth: Payer: Self-pay | Admitting: Hematology

## 2018-11-28 ENCOUNTER — Inpatient Hospital Stay: Payer: BLUE CROSS/BLUE SHIELD

## 2018-11-28 ENCOUNTER — Telehealth: Payer: Self-pay | Admitting: Emergency Medicine

## 2018-11-28 ENCOUNTER — Other Ambulatory Visit: Payer: Self-pay | Admitting: Hematology

## 2018-11-28 NOTE — Telephone Encounter (Signed)
Called pt to see if she was coming to phlebotomy appt today.  Pt states she called this am to cancel/reschedule d/t her husband recently being exposed to Covid-19, states he is getting tested but in the meantime she doesn't want to come into the cancer center and expose anyone.  Appt cancelled for today (11/28/2018), MD Mosetta Putt made aware, and scheduling made aware that pt's next phlebotomy appt will need to be delayed at least 2 weeks unless her husband's test comes back negative.  Pt VU of plan and to f/u as needed with any other questions or concerns.

## 2018-11-28 NOTE — Telephone Encounter (Signed)
Per 5/11 schedule message (*2) moved 5/11 phleb to 5/27. Confirmed with patient. Per 2nd schedule message moved no less than  2 weeks out - patient exposed to covid-19 and will self quarantine 14 days.

## 2018-12-14 ENCOUNTER — Inpatient Hospital Stay: Payer: BLUE CROSS/BLUE SHIELD

## 2018-12-14 ENCOUNTER — Other Ambulatory Visit: Payer: Self-pay

## 2018-12-14 MED ORDER — SODIUM CHLORIDE 0.9 % IV SOLN
Freq: Once | INTRAVENOUS | Status: AC
  Administered 2018-12-14: 15:00:00 via INTRAVENOUS
  Filled 2018-12-14: qty 250

## 2019-02-21 ENCOUNTER — Inpatient Hospital Stay: Payer: BC Managed Care – PPO | Attending: Hematology

## 2019-02-21 ENCOUNTER — Other Ambulatory Visit: Payer: Self-pay

## 2019-02-21 DIAGNOSIS — D500 Iron deficiency anemia secondary to blood loss (chronic): Secondary | ICD-10-CM

## 2019-02-21 LAB — CBC WITH DIFFERENTIAL (CANCER CENTER ONLY)
Abs Immature Granulocytes: 0.03 10*3/uL (ref 0.00–0.07)
Basophils Absolute: 0.1 10*3/uL (ref 0.0–0.1)
Basophils Relative: 1 %
Eosinophils Absolute: 1.1 10*3/uL — ABNORMAL HIGH (ref 0.0–0.5)
Eosinophils Relative: 12 %
HCT: 40.1 % (ref 36.0–46.0)
Hemoglobin: 13.6 g/dL (ref 12.0–15.0)
Immature Granulocytes: 0 %
Lymphocytes Relative: 32 %
Lymphs Abs: 3.1 10*3/uL (ref 0.7–4.0)
MCH: 31.3 pg (ref 26.0–34.0)
MCHC: 33.9 g/dL (ref 30.0–36.0)
MCV: 92.4 fL (ref 80.0–100.0)
Monocytes Absolute: 0.6 10*3/uL (ref 0.1–1.0)
Monocytes Relative: 6 %
Neutro Abs: 4.8 10*3/uL (ref 1.7–7.7)
Neutrophils Relative %: 49 %
Platelet Count: 253 10*3/uL (ref 150–400)
RBC: 4.34 MIL/uL (ref 3.87–5.11)
RDW: 11.9 % (ref 11.5–15.5)
WBC Count: 9.7 10*3/uL (ref 4.0–10.5)
nRBC: 0 % (ref 0.0–0.2)

## 2019-02-21 LAB — IRON AND TIBC
Iron: 123 ug/dL (ref 41–142)
Saturation Ratios: 44 % (ref 21–57)
TIBC: 283 ug/dL (ref 236–444)
UIBC: 159 ug/dL (ref 120–384)

## 2019-02-21 LAB — FERRITIN: Ferritin: 35 ng/mL (ref 11–307)

## 2019-02-28 ENCOUNTER — Telehealth: Payer: Self-pay | Admitting: *Deleted

## 2019-02-28 NOTE — Telephone Encounter (Signed)
Left message with note below 

## 2019-02-28 NOTE — Telephone Encounter (Signed)
-----   Message from Jesse Fall, [place] sent at 02/28/2019  3:32 PM EDT -----  ----- Message ----- From: Truitt Merle, MD Sent: 02/28/2019  10:08 AM EDT To: Arlice Colt Pod 1  Please let pt know her iron level was good last week, no need for phlebotomy this time, thanks   Truitt Merle  02/28/2019

## 2019-05-17 ENCOUNTER — Other Ambulatory Visit: Payer: BLUE CROSS/BLUE SHIELD

## 2019-05-22 ENCOUNTER — Inpatient Hospital Stay: Payer: BC Managed Care – PPO | Attending: Hematology

## 2019-05-22 ENCOUNTER — Telehealth: Payer: Self-pay | Admitting: Hematology

## 2019-05-22 NOTE — Telephone Encounter (Signed)
Returned patient's phone call regarding rescheduling an appointment, left a voicemail. 

## 2019-05-24 ENCOUNTER — Inpatient Hospital Stay: Payer: BC Managed Care – PPO | Admitting: Hematology

## 2019-07-28 ENCOUNTER — Telehealth: Payer: Self-pay | Admitting: Hematology

## 2019-07-28 NOTE — Telephone Encounter (Signed)
Returned patient' phone call regarding rescheduling missed November appointment, rescheduled for 02/01.

## 2019-08-21 ENCOUNTER — Inpatient Hospital Stay: Payer: BC Managed Care – PPO | Attending: Hematology

## 2019-08-21 ENCOUNTER — Inpatient Hospital Stay: Payer: BC Managed Care – PPO | Admitting: Hematology

## 2019-09-20 ENCOUNTER — Other Ambulatory Visit: Payer: Self-pay

## 2019-09-20 ENCOUNTER — Ambulatory Visit (HOSPITAL_COMMUNITY)
Admission: RE | Admit: 2019-09-20 | Discharge: 2019-09-20 | Disposition: A | Discharge status: Home / Self Care | Payer: BC Managed Care – PPO | Source: Ambulatory Visit | Attending: Hematology | Admitting: Hematology

## 2019-09-21 NOTE — Progress Notes (Signed)
[facility]   Telephone:([phone] Fax:(336) [phone]   Clinic Follow up Note   Patient Care Team: Eartha Inch, MD as PCP - General (Family Medicine) Lucila Maine as Physician Assistant (Physician Assistant) Levert Feinstein, MD as Consulting Physician (Oncology)  Date of Service:  09/27/2019  CHIEF COMPLAINT: Hereditary hemochromatosis   CURRENT THERAPY:  Therapeutic Phlebotomy as needed   INTERVAL HISTORY:  Chelsea Alexander is here for a follow up. She was last seen by me 10 months ago. She presents to the clinic alone. She notes she is doing well. She notes she missed appointment because her college kids had gotten COVID19. She notes she has had endoscopy in the past with Dr. Lottie Mussel which she notes was fine. She has only had 2 phlebotomies since 2012. She notes RUQ pain which she feels is related to her Fibromyalgia. She is interested in work up for her fatty liver and to check for iron deposits. She wonders can processed foods hold too much iron. She will continue her low carb and gluten free diet.    REVIEW OF SYSTEMS:   Constitutional: Denies fevers, chills or abnormal weight loss Eyes: Denies blurriness of vision Ears, nose, mouth, throat, and face: Denies mucositis or sore throat Respiratory: Denies cough, dyspnea or wheezes Cardiovascular: Denies palpitation, chest discomfort or lower extremity swelling Gastrointestinal:  Denies nausea, heartburn or change in bowel habits  Skin: Denies abnormal skin rashes MSK: (+) Fibromyalgia  Lymphatics: Denies new lymphadenopathy or easy bruising Neurological:Denies numbness, tingling or new weaknesses Behavioral/Psych: Mood is stable, no new changes  All other systems were reviewed with the patient and are negative.  MEDICAL HISTORY:  Past Medical History:  Diagnosis Date  . Anxiety   . Asthma   . Attention deficit disorder    without hyperactivity  . Chronic low back pain   . Fatigue   .  Generalized subcutaneous nodules 08/14/2011  . GERD (gastroesophageal reflux disease)   . Hemochromatosis, hereditary (HCC)   . Lyme disease    tx in [place]  . Muscle weakness   . Palpitations   . Tremor     SURGICAL HISTORY: Past Surgical History:  Procedure Laterality Date  . BREAST ENHANCEMENT SURGERY  1991  . CESAREAN SECTION  2004  . CHOLECYSTECTOMY  2011  . NASAL SINUS SURGERY  2000    I have reviewed the social history and family history with the patient and they are unchanged from previous note.  ALLERGIES:  is allergic to compazine; sulfa antibiotics; amoxicillin; ciprofloxacin; levofloxacin; tetracyclines & related; diphenhydramine hcl; and morphine and related.  MEDICATIONS:  Current Outpatient Medications  Medication Sig Dispense Refill  . ALPRAZolam (XANAX) 0.5 MG tablet Take 0.5 mg by mouth 3 (three) times daily as needed.    . desvenlafaxine (PRISTIQ) 50 MG 24 hr tablet Take 50 mg by mouth daily.    Marland Kitchen EPINEPHrine (EPIPEN JR) 0.15 MG/0.3ML injection Inject 0.15 mg into the muscle as needed.      . ergocalciferol (VITAMIN D2) 1.25 MG (50000 UT) capsule Take 50,000 Units by mouth once a week.    Marland Kitchen HYDROmorphone (DILAUDID) 4 MG tablet Take by mouth every 4 (four) hours as needed for severe pain (and at bedtime).    Marland Kitchen levalbuterol (XOPENEX HFA) 45 MCG/ACT inhaler Inhale 2 puffs into the lungs every 4 (four) hours as needed.      . lidocaine (LIDODERM) 5 %     . phentermine 30 MG capsule  No current facility-administered medications for this visit.    PHYSICAL EXAMINATION:  Vitals:   09/27/19 1206  BP: (!) 129/91  Pulse: (!) 108  Resp: 17  Temp: 98.7 F (37.1 C)  SpO2: 99%   Filed Weights   09/27/19 1206  Weight: 170 lb (77.1 kg)    Due to COVID19 we will limit examination to appearance. Patient had no complaints.  GENERAL:alert, no distress and comfortable SKIN: skin color normal, no rashes or significant lesions EYES: normal, Conjunctiva are pink and  non-injected, sclera clear  NEURO: alert & oriented x 3 with fluent speech   LABORATORY DATA:  I have reviewed the data as listed CBC Latest Ref Rng & Units 09/27/2019 02/21/2019 11/21/2018  WBC 4.0 - 10.5 K/uL 8.1 9.7 7.7  Hemoglobin 12.0 - 15.0 g/dL 13.6 13.6 14.8  Hematocrit 36.0 - 46.0 % 40.8 40.1 43.5  Platelets 150 - 400 K/uL 258 253 255     CMP Latest Ref Rng & Units 11/21/2018 05/23/2018 04/23/2016  Glucose 70 - 99 mg/dL 129(H) 123(H) 89  BUN 6 - 20 mg/dL 14 11 15   Creatinine 0.44 - 1.00 mg/dL 0.83 0.84 0.70  Sodium 135 - 145 mmol/L 143 140 140  Potassium 3.5 - 5.1 mmol/L 4.3 4.3 4.7  Chloride 98 - 111 mmol/L 103 99 97  CO2 22 - 32 mmol/L 29 24 26   Calcium 8.9 - 10.3 mg/dL 10.0 9.8 9.7  Total Protein 6.5 - 8.1 g/dL 7.5 6.9 6.6  Total Bilirubin 0.3 - 1.2 mg/dL 0.4 0.2 0.4  Alkaline Phos 38 - 126 U/L 61 56 82  AST 15 - 41 U/L 17 35 13  ALT 0 - 44 U/L 19 44(H) 15   US abdomen 09/20/19  IMPRESSION: Enlarged and heterogeneous liver with increased echogenicity. Findings may represent fatty liver or steatohepatitis or changes of cirrhosis/fibrosis. Clinical correlation and further evaluation with elastography is recommended   RADIOGRAPHIC STUDIES: I have personally reviewed the radiological images as listed and agreed with the findings in the report. No results found.   ASSESSMENT & PLAN:  Chelsea Alexander is a 54 y.o. female with   1. 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 Her sister and mother's side of her family have been diagnosed as well. Her children was tested and all negative.   -She consistently has ferritin levels less than 100 and has only required phlebotomy a few times   -Goal of Ferritin<50 ng/ml and transferrin saturation <50%, through Phlebotomy. Last phlebotomy in 11/2018.  -We discussed his US Abdomen from 09/20/19 which showed enlarged and heterogeneous liver, probable fatty liver or steatohepatitis or changes of  cirrhosis/fibrosis. -I discussed her Fatty liver could be related to high cholesterol level and being overweight. She denies high carbs or gluten diet. She notes due to fibromyalgia she has been on prednisone repeatedly over the years. I encouraged her to work on weight loss and monitor cholesterol with PCP.  -Elastography was recommended. If abnormal will refer her back to Dr Thana Farr. I will also do US abdomen to monitor her iron deposit in liver, she is agreeable.  -Labs reviewed, CBC WNL with HCT 40.8%. Ferritin and Iron panel still pending, normal at last visit.  -Will continue with lab every 6 months, f/u in 1 year.   2. Fibromyalgia  -With fatigue and muscle pain. Currently controlled, but her main pain.   3. Cancer Screenings -She has not had mammogram since 05/2013 due to fibromyalgia pain. I  encouraged her to take Tylenol before screening to help with pain. She is interested.  -She notes she has saline implants with scar tissue in which has needed to be changes occasionally. I previously referred her to plastic surgeon in [place] for her to see after COVID-19 improves. Implants should be changed every 10 years.  -Last colonoscopy and endoscopy with Dr Lottie Mussel was over 7 years ago. She can f/u with him to repeat.  -She will do US liver periodically to monitor for liver cirrhosis.   3. Anxiety, depression  -On Prestiq. Currently well managed. Mood stable.   PLAN:  -Liver elastography in a few weeks, I will call her with results  -Lab every 6 months X2, will schedule phlebotomy if ferritin more than 50, or saturation more than 50% -F/u in 1 year.    No problem-specific Assessment & Plan notes found for this encounter.   Orders Placed This Encounter  Procedures  . [place] ELASTOGRAPHY LIVER    Standing Status:   Future    Standing Expiration Date:   11/26/2020    Order Specific Question:   Reason for Exam (SYMPTOM  OR DIAGNOSIS REQUIRED)    Answer:   rule out liver cirrhosis     Order Specific Question:   Preferred imaging location?    Answer:   [facility]   All questions were answered. The patient knows to call the clinic with any problems, questions or concerns. No barriers to learning was detected. The total time spent in the appointment was 25 minutes.     Malachy Mood, MD 09/27/2019   I, Delphina Cahill, am acting as scribe for Malachy Mood, MD.   I have reviewed the above documentation for accuracy and completeness, and I agree with the above.   Addendum  Her ferritin was 80 today, will schedule phlebotomy for her next week.  Malachy Mood  09/27/2019

## 2019-09-27 ENCOUNTER — Inpatient Hospital Stay: Payer: BC Managed Care – PPO | Attending: Hematology | Admitting: Hematology

## 2019-09-27 ENCOUNTER — Encounter: Payer: Self-pay | Admitting: Hematology

## 2019-09-27 ENCOUNTER — Other Ambulatory Visit: Payer: Self-pay

## 2019-09-27 ENCOUNTER — Inpatient Hospital Stay: Payer: BC Managed Care – PPO

## 2019-09-27 DIAGNOSIS — R1011 Right upper quadrant pain: Secondary | ICD-10-CM | POA: Diagnosis not present

## 2019-09-27 DIAGNOSIS — K219 Gastro-esophageal reflux disease without esophagitis: Secondary | ICD-10-CM | POA: Diagnosis not present

## 2019-09-27 DIAGNOSIS — Z79899 Other long term (current) drug therapy: Secondary | ICD-10-CM | POA: Diagnosis not present

## 2019-09-27 DIAGNOSIS — D500 Iron deficiency anemia secondary to blood loss (chronic): Secondary | ICD-10-CM

## 2019-09-27 DIAGNOSIS — F419 Anxiety disorder, unspecified: Secondary | ICD-10-CM | POA: Diagnosis not present

## 2019-09-27 DIAGNOSIS — M797 Fibromyalgia: Secondary | ICD-10-CM | POA: Insufficient documentation

## 2019-09-27 DIAGNOSIS — J45909 Unspecified asthma, uncomplicated: Secondary | ICD-10-CM | POA: Insufficient documentation

## 2019-09-27 LAB — CBC WITH DIFFERENTIAL (CANCER CENTER ONLY)
Abs Immature Granulocytes: 0.01 10*3/uL (ref 0.00–0.07)
Basophils Absolute: 0.1 10*3/uL (ref 0.0–0.1)
Basophils Relative: 1 %
Eosinophils Absolute: 0.4 10*3/uL (ref 0.0–0.5)
Eosinophils Relative: 5 %
HCT: 40.8 % (ref 36.0–46.0)
Hemoglobin: 13.6 g/dL (ref 12.0–15.0)
Immature Granulocytes: 0 %
Lymphocytes Relative: 35 %
Lymphs Abs: 2.9 10*3/uL (ref 0.7–4.0)
MCH: 31.4 pg (ref 26.0–34.0)
MCHC: 33.3 g/dL (ref 30.0–36.0)
MCV: 94.2 fL (ref 80.0–100.0)
Monocytes Absolute: 0.5 10*3/uL (ref 0.1–1.0)
Monocytes Relative: 7 %
Neutro Abs: 4.2 10*3/uL (ref 1.7–7.7)
Neutrophils Relative %: 52 %
Platelet Count: 258 10*3/uL (ref 150–400)
RBC: 4.33 MIL/uL (ref 3.87–5.11)
RDW: 11.9 % (ref 11.5–15.5)
WBC Count: 8.1 10*3/uL (ref 4.0–10.5)
nRBC: 0 % (ref 0.0–0.2)

## 2019-09-27 LAB — FERRITIN: Ferritin: 80 ng/mL (ref 11–307)

## 2019-09-27 LAB — IRON AND TIBC
Iron: 85 ug/dL (ref 41–142)
Saturation Ratios: 26 % (ref 21–57)
TIBC: 324 ug/dL (ref 236–444)
UIBC: 239 ug/dL (ref 120–384)

## 2019-09-28 ENCOUNTER — Telehealth: Payer: Self-pay | Admitting: Hematology

## 2019-09-28 NOTE — Telephone Encounter (Signed)
Scheduled appt per 3/10 los.  Sent a message to HIM pool to get a calendar mailed out. 

## 2019-10-04 ENCOUNTER — Telehealth: Payer: Self-pay | Admitting: Hematology

## 2019-10-04 NOTE — Telephone Encounter (Signed)
Scheduled appt per 3/11 sch msg - unable to reach pt . Left message with appt date and time

## 2019-10-10 ENCOUNTER — Ambulatory Visit (HOSPITAL_COMMUNITY)
Admission: RE | Admit: 2019-10-10 | Discharge: 2019-10-10 | Disposition: A | Discharge status: Home / Self Care | Payer: BC Managed Care – PPO | Source: Ambulatory Visit | Attending: Hematology | Admitting: Hematology

## 2019-10-10 ENCOUNTER — Other Ambulatory Visit: Payer: Self-pay

## 2019-10-11 ENCOUNTER — Telehealth: Payer: Self-pay

## 2019-10-11 ENCOUNTER — Telehealth: Payer: Self-pay | Admitting: Hematology

## 2019-10-11 NOTE — Telephone Encounter (Signed)
Ms Hyppolite called regarding results of [place] elastography.  She wanted to know next steps if any.  We discussed scheduling a phlebotomy.  Message sent to schedulers

## 2019-10-11 NOTE — Telephone Encounter (Signed)
Scheduled appt per 3/24 sch message - pt aware of appt date and time   

## 2019-10-12 ENCOUNTER — Other Ambulatory Visit: Payer: Self-pay | Admitting: Hematology

## 2019-10-16 ENCOUNTER — Telehealth: Payer: Self-pay

## 2019-10-16 NOTE — Telephone Encounter (Signed)
Attempted to contact Chelsea Alexander regarding UA elastography results. No answer.

## 2019-10-17 ENCOUNTER — Inpatient Hospital Stay: Payer: BC Managed Care – PPO

## 2019-10-17 ENCOUNTER — Other Ambulatory Visit: Payer: Self-pay

## 2019-10-17 ENCOUNTER — Telehealth: Payer: Self-pay | Admitting: *Deleted

## 2019-10-17 DIAGNOSIS — D500 Iron deficiency anemia secondary to blood loss (chronic): Secondary | ICD-10-CM

## 2019-10-17 LAB — CBC WITH DIFFERENTIAL (CANCER CENTER ONLY)
Abs Immature Granulocytes: 0.01 10*3/uL (ref 0.00–0.07)
Basophils Absolute: 0.1 10*3/uL (ref 0.0–0.1)
Basophils Relative: 1 %
Eosinophils Absolute: 0.4 10*3/uL (ref 0.0–0.5)
Eosinophils Relative: 5 %
HCT: 43.4 % (ref 36.0–46.0)
Hemoglobin: 14.2 g/dL (ref 12.0–15.0)
Immature Granulocytes: 0 %
Lymphocytes Relative: 36 %
Lymphs Abs: 2.8 10*3/uL (ref 0.7–4.0)
MCH: 30.7 pg (ref 26.0–34.0)
MCHC: 32.7 g/dL (ref 30.0–36.0)
MCV: 93.9 fL (ref 80.0–100.0)
Monocytes Absolute: 0.5 10*3/uL (ref 0.1–1.0)
Monocytes Relative: 7 %
Neutro Abs: 3.9 10*3/uL (ref 1.7–7.7)
Neutrophils Relative %: 51 %
Platelet Count: 231 10*3/uL (ref 150–400)
RBC: 4.62 MIL/uL (ref 3.87–5.11)
RDW: 11.8 % (ref 11.5–15.5)
WBC Count: 7.6 10*3/uL (ref 4.0–10.5)
nRBC: 0 % (ref 0.0–0.2)

## 2019-10-17 LAB — IRON AND TIBC
Iron: 162 ug/dL — ABNORMAL HIGH (ref 41–142)
Saturation Ratios: 52 % (ref 21–57)
TIBC: 314 ug/dL (ref 236–444)
UIBC: 151 ug/dL (ref 120–384)

## 2019-10-17 LAB — FERRITIN: Ferritin: 88 ng/mL (ref 11–307)

## 2019-10-17 NOTE — Patient Instructions (Signed)

## 2019-10-17 NOTE — Telephone Encounter (Signed)
Called pt per Dr Latanya Maudlin instructions & informed that [place] result is unlikely cirrhosis but will cont to monitor with [place] every couple of years. She asked if there were any iron deposits & should she see a GI Dr & if so who would Dr Mosetta Putt recommend.  Message routed to Dr Feng/pod RN

## 2019-10-23 ENCOUNTER — Telehealth: Payer: Self-pay | Admitting: Hematology

## 2019-10-23 NOTE — Telephone Encounter (Signed)
I called pt and discussed her recent US result, and answered all her questions, including diet and vitC supplement. We discussed iron deposit is not evaluated by [place], but can be evaluated by liver MRI which she never had before. She is already on vigorous phlebotomy program, I do not feel liver MRI is necessary for now.   She appreciated the call.  Malachy Mood  10/23/2019

## 2020-03-29 ENCOUNTER — Inpatient Hospital Stay: Payer: BC Managed Care – PPO

## 2020-05-19 IMAGING — US US ABDOMEN COMPLETE
1 series · 13 of 25 positions shown · non-contrast
Comparison: Right upper quadrant ultrasound dated 10/04/2009. CT
abdomen pelvis dated 09/16/2010.

CLINICAL DATA: 53-year-old female with concern for cirrhosis.
History of hemochromatosis.

EXAM:
ABDOMEN ULTRASOUND COMPLETE

[Series 1: us abdomen complete · 13 of 101 slices shown]
[im 1/101]
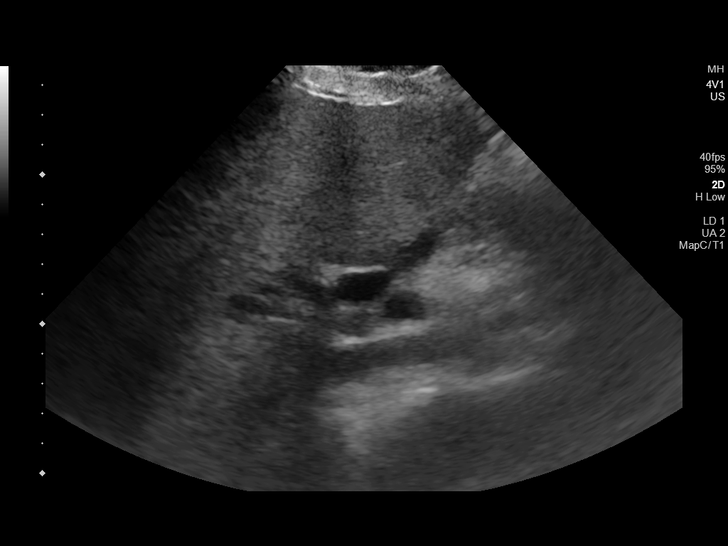
[im 9/101]
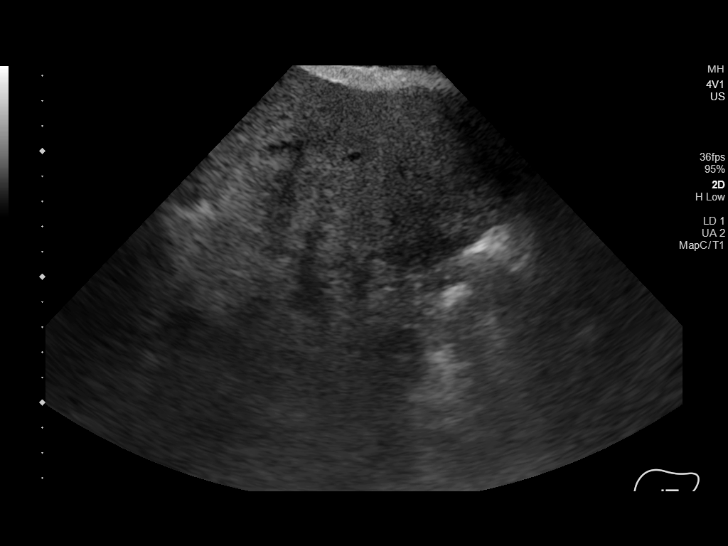
[im 17/101]
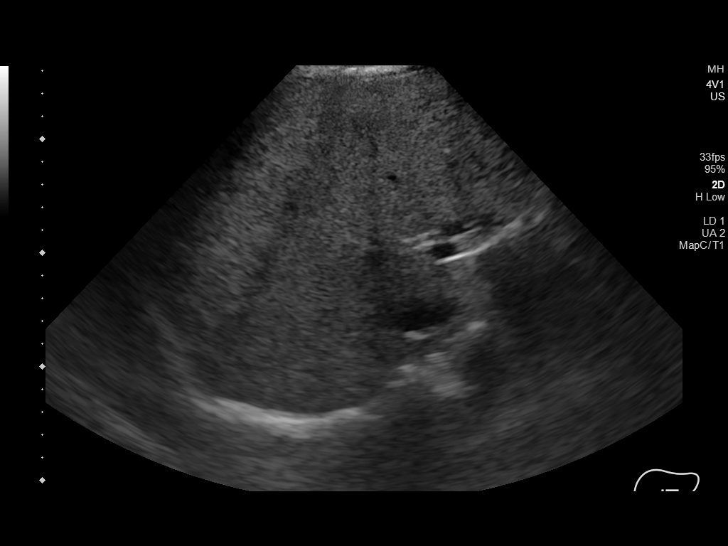
[im 26/101]
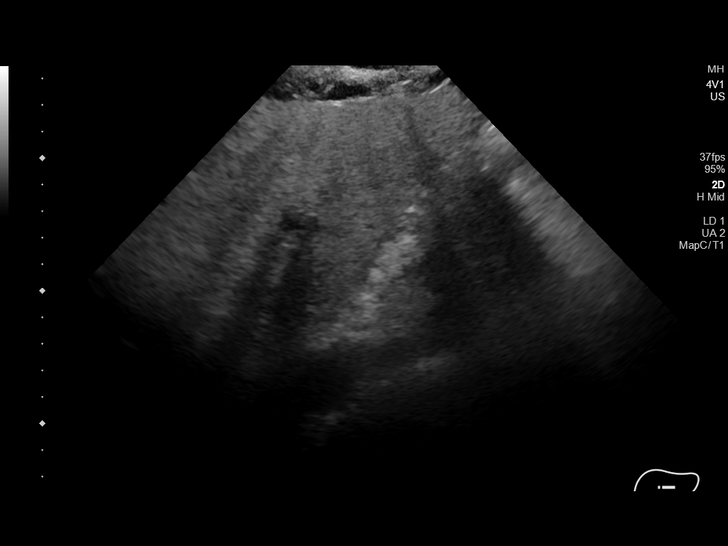
[im 34/101]
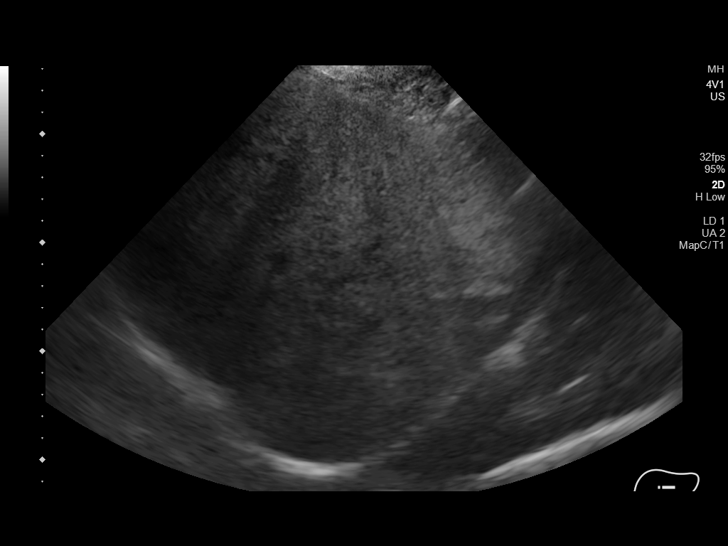
[im 42/101]
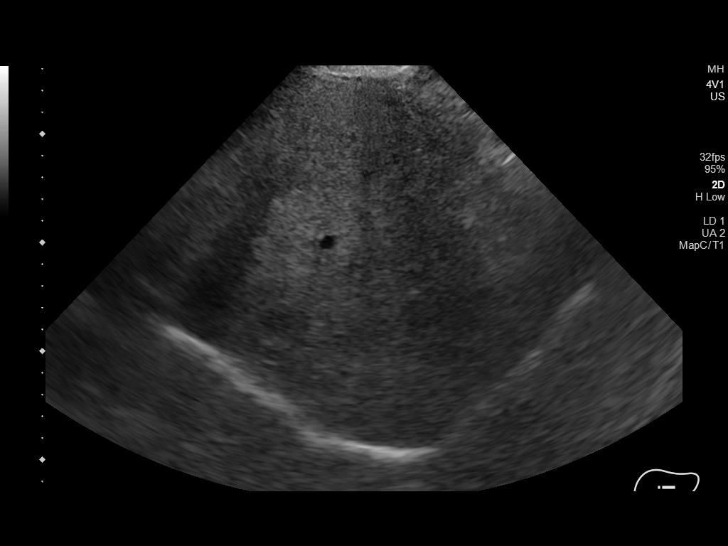
[im 51/101]
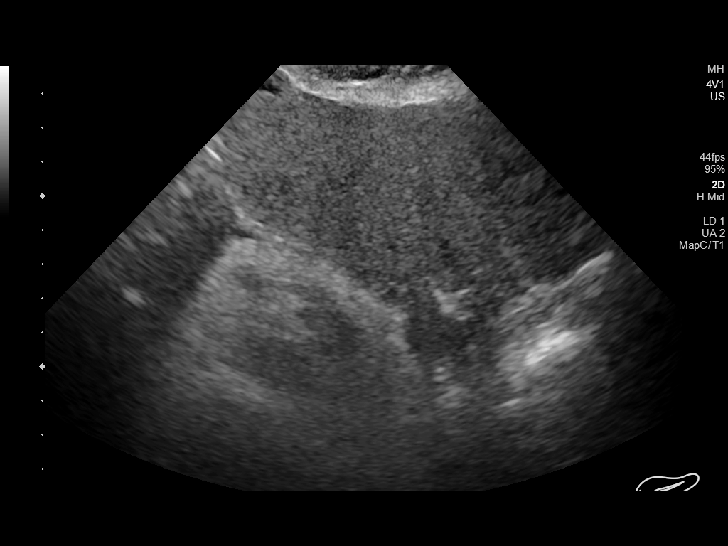
[im 59/101]
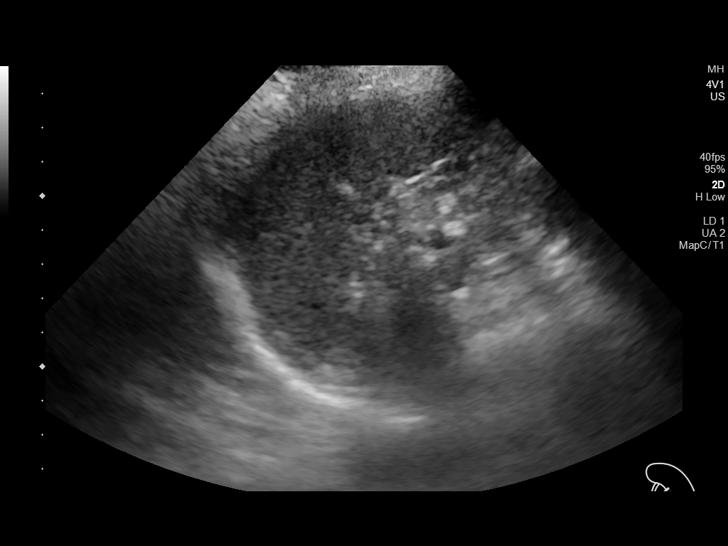
[im 67/101]
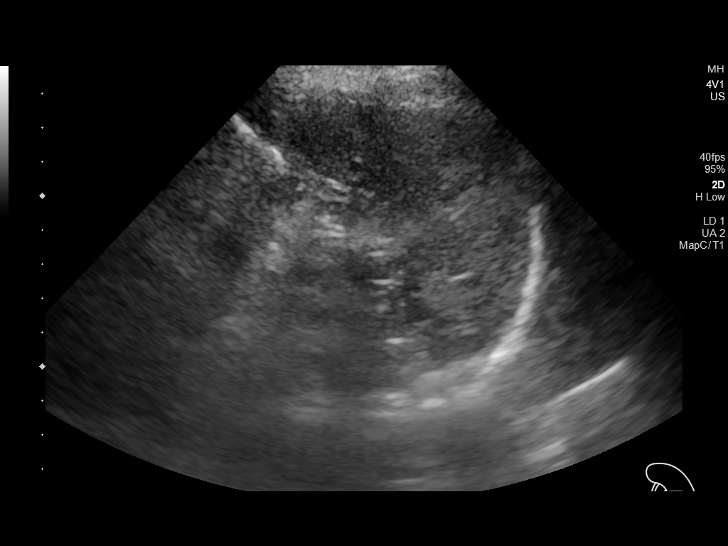
[im 76/101]
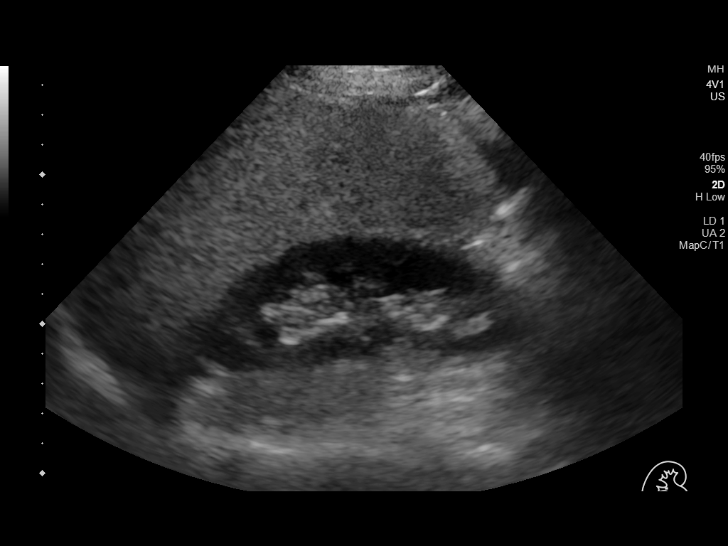
[im 84/101]
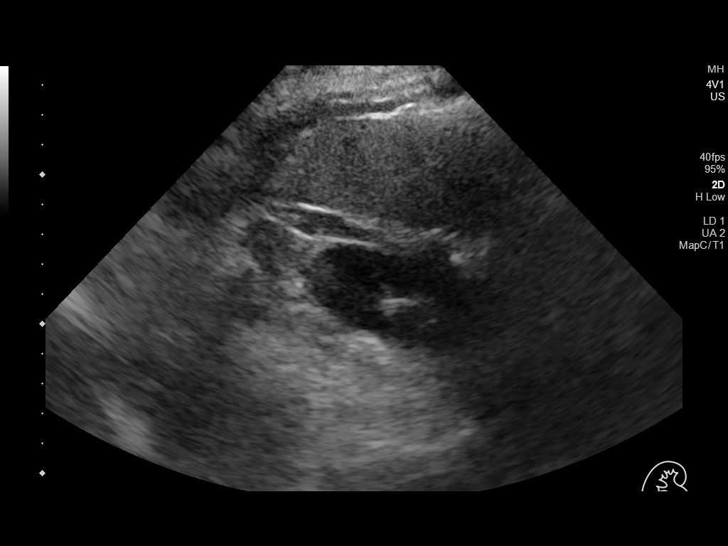
[im 92/101]
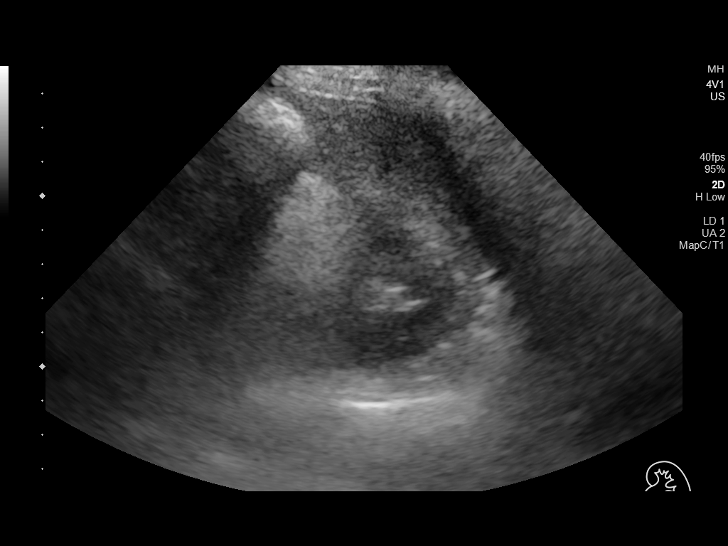
[im 101/101]
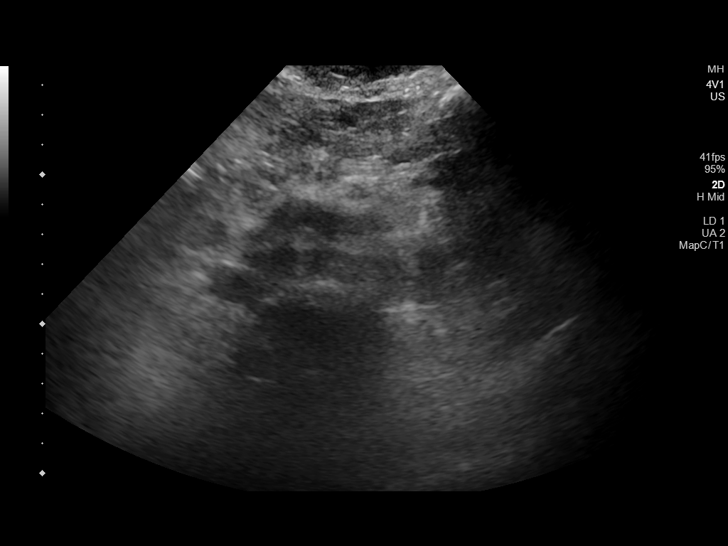

[13 of 25 positions shown; findings below may reference images not displayed]

FINDINGS: Gallbladder: Cholecystectomy.

Common bile duct: Diameter: 9 mm

Liver: Delivered is enlarged. The liver demonstrates a coarsened
echotexture with heterogeneous and increased echogenicity which may
be seen in the setting of fatty infiltration. Underlying
fibrosis/cirrhosis is not excluded. Elastography may provide better
evaluation on a nonemergent/outpatient basis. Portal vein is patent
on color Doppler imaging with normal direction of blood flow towards
the liver.

IVC: No abnormality visualized.

Pancreas: Visualized portion unremarkable.

Spleen: Size and appearance within normal limits.

Right Kidney: Length: 11 cm. Normal echogenicity. No hydronephrosis
or shadowing stone

Left Kidney: Length: 11 cm. Normal echogenicity. No hydronephrosis
or shadowing stone.

Abdominal aorta: No aneurysm visualized.

Other findings: None.
IMPRESSION: Enlarged and heterogeneous liver with increased echogenicity.
Findings may represent fatty liver or steatohepatitis or changes of
cirrhosis/fibrosis. Clinical correlation and further evaluation with
elastography is recommended.

## 2020-06-08 IMAGING — US US LIVER ELASTOGRAPHY
1 series · 12 of 25 positions shown · non-contrast
Comparison: Ultrasound abdomen 09/20/2019

CLINICAL DATA: Hereditary hemochromatosis

EXAM:
US LIVER ELASTOGRAPHY
TECHNIQUE: Sonography of the liver was performed. In addition, ultrasound
elastography evaluation of the liver was performed. A region of
interest was placed within the right lobe of the liver. Following
application of a compressive sonographic pulse, tissue
compressibility was assessed. Multiple assessments were performed at
the selected site. Median tissue compressibility was determined.
Previously, hepatic stiffness was assessed by shear wave velocity.
Based on recently published Society of Radiologists in Ultrasound
consensus article, reporting is now recommended to be performed in
the SI units of pressure (kiloPascals) representing hepatic
stiffness/elasticity. The obtained result is compared to the
published reference standards. (cACLD = compensated Advanced Chronic
Liver Disease)

[Series 1: us liver elastography · 12 of 49 slices shown]
[im 3/49]
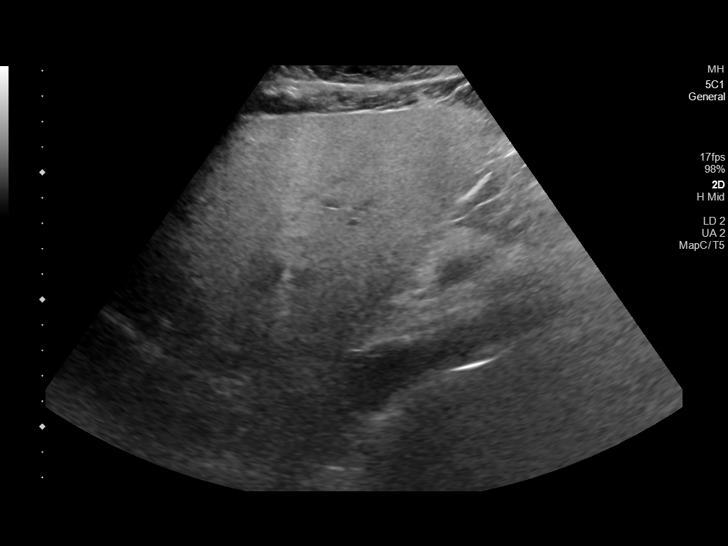
[im 7/49]
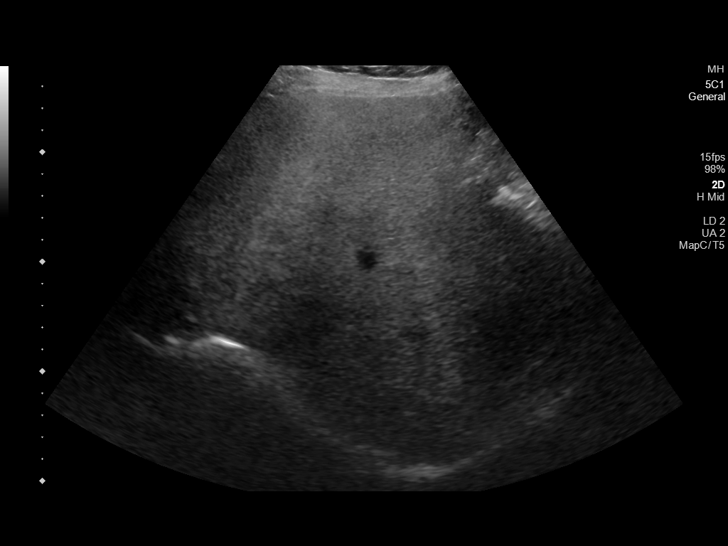
[im 11/49]
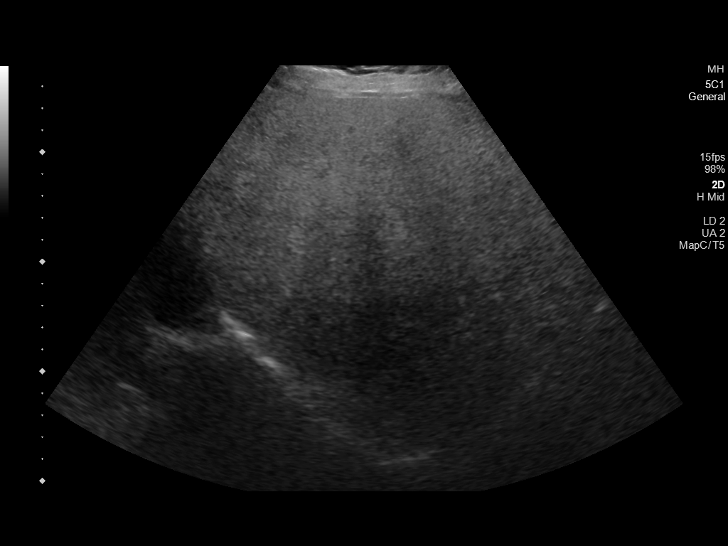
[im 15/49]
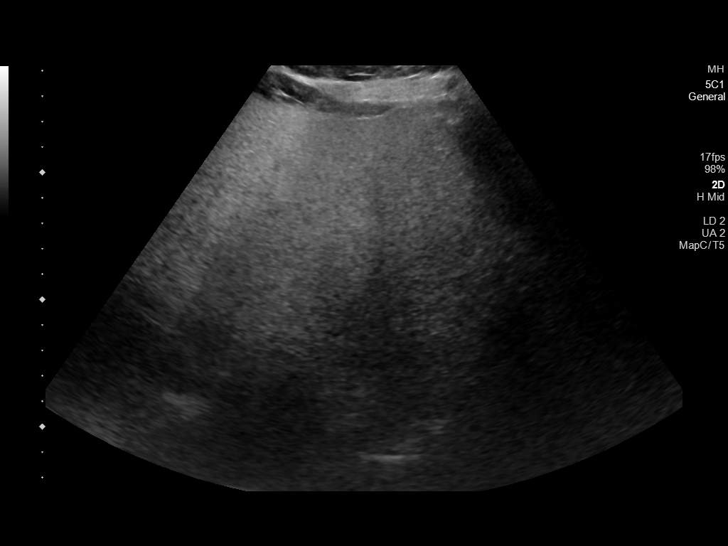
[im 19/49]
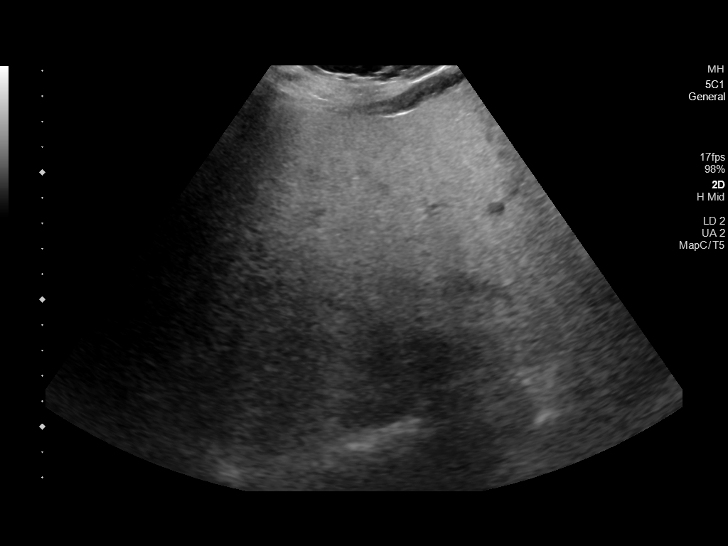
[im 23/49]
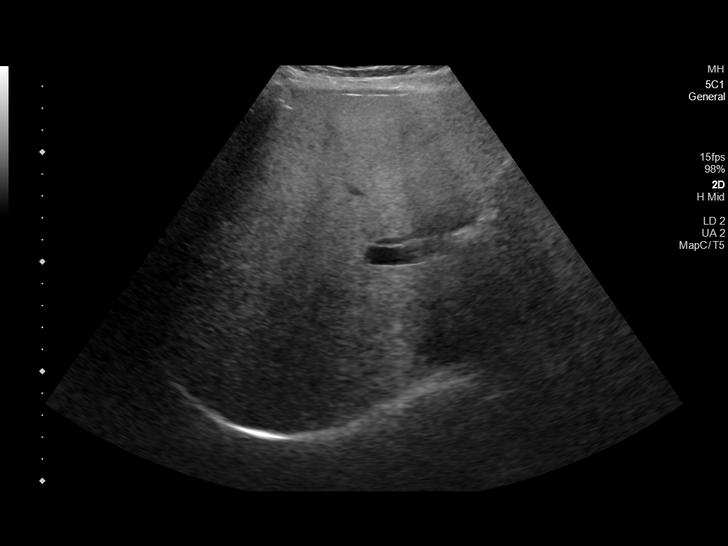
[im 27/49]
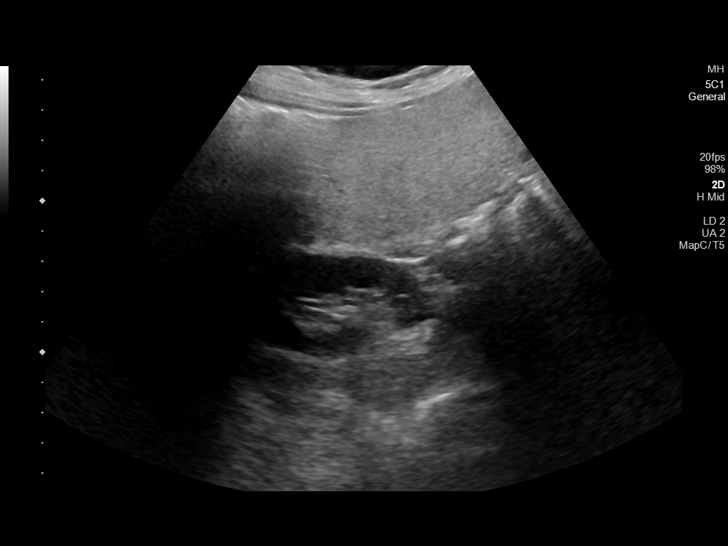
[im 31/49]
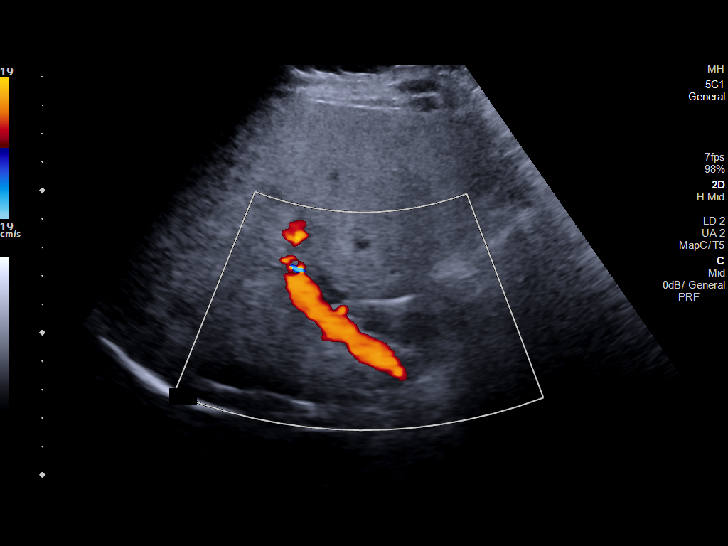
[im 35/49]
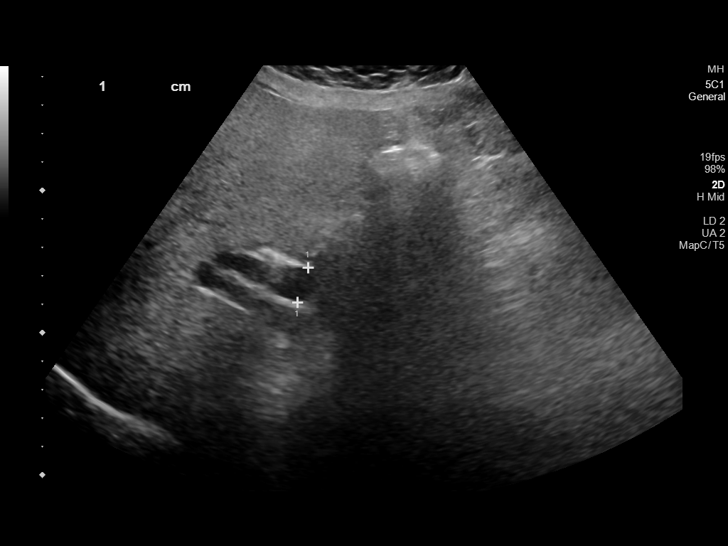
[im 39/49]
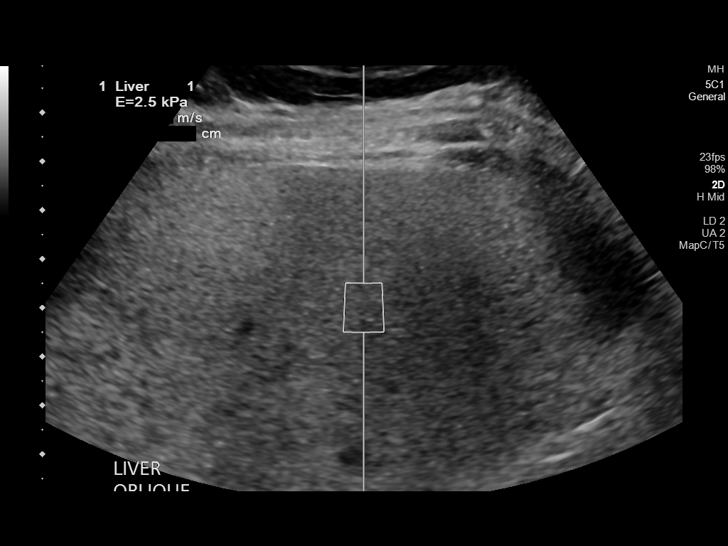
[im 43/49]
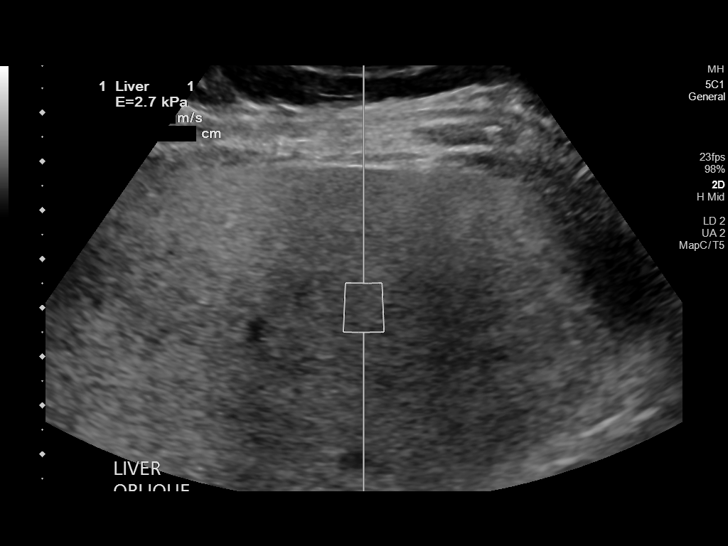
[im 47/49]
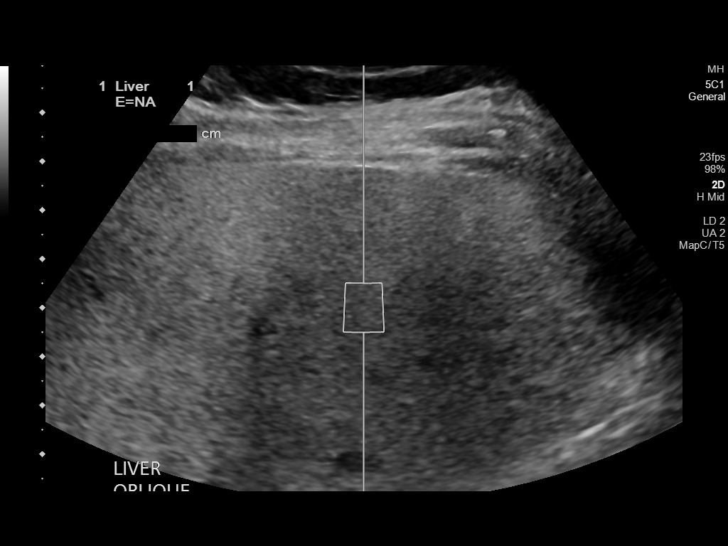

[12 of 25 positions shown; findings below may reference images not displayed]

FINDINGS: Liver: Significantly increased hepatic echogenicity, can be seen
with fatty infiltration, cirrhosis and certain infiltrative
disorders. No focal hepatic mass or nodularity. Prominent CBD 11 mm
diameter, measured 9 mm on 09/20/2019. Portal vein is patent on
color Doppler imaging with normal direction of blood flow towards
the liver.

ULTRASOUND HEPATIC ELASTOGRAPHY

Device: Siemens Helix VTQ

Patient position: Oblique

Transducer: [id number]

Number of measurements: 10

Hepatic segment:  8

Median kPa:

IQR:

IQR/Median kPa ratio:

Data quality:  Good

Diagnostic category:  < or = 5 kPa: high probability of being normal
IMPRESSION: ULTRASOUND LIVER:

Echogenic liver, can be seen with fatty infiltration, cirrhosis and
some infiltrative disorders.

No focal hepatic mass or nodularity.

Dilated CBD 11 mm diameter, slightly increased from 9 mm on
09/20/2019; recommend correlation with LFTs.

ULTRASOUND HEPATIC ELASTOGRAPHY:

Median kPa:

Diagnostic category:  < or = 5 kPa: high probability of being normal

The use of hepatic elastography is applicable to patients with viral
hepatitis and non-alcoholic fatty liver disease. At this time, there
is insufficient data for the referenced cut-off values and use in
other causes of liver disease, including alcoholic liver disease.
Patients, however, may be assessed by elastography and serve as
their own reference standard/baseline.

In patients with non-alcoholic liver disease, the values suggesting
compensated advanced chronic liver disease (cACLD) may be lower, and
patients may need additional testing with elasticity results of [DATE]
kPa.

Please note that abnormal hepatic elasticity and shear wave
velocities may also be identified in clinical settings other than
with hepatic fibrosis, such as: acute hepatitis, elevated right
heart and central venous pressures including use of beta blockers,
Ebadat disease (Lupacescu), infiltrative processes such as
mastocytosis/amyloidosis/infiltrative tumor/lymphoma, extrahepatic
cholestasis, with hyperemia in the post-prandial state, and with
liver transplantation. Correlation with patient history, laboratory
data, and clinical condition recommended.

Diagnostic Categories:

< or =5 kPa: high probability of being normal

< or =9 kPa: in the absence of other known clinical signs, rules [DATE] kPa and ?13 kPa: suggestive of cACLD, but needs further testing

>13 kPa: highly suggestive of cACLD

> or =17 kPa: highly suggestive of cACLD with an increased
probability of clinically significant portal hypertension

## 2020-09-23 NOTE — Progress Notes (Incomplete)
[facility]   Telephone:([phone] Fax:(336) [phone]   Clinic Follow up Note   Patient Care Team: Eartha Inch, MD as PCP - General (Family Medicine) Lucila Maine as Physician Assistant (Physician Assistant) Levert Feinstein, MD as Consulting Physician (Oncology)  Date of Service:  09/23/2020  CHIEF COMPLAINT: F/u of Hereditary hemochromatosis  CURRENT THERAPY:  Therapeutic Phlebotomy as needed   INTERVAL HISTORY: *** Chelsea Alexander is here for a follow up of Hereditary hemochromatosis. She was last seen by me 1 year ago. She presents to the clinic alone.    REVIEW OF SYSTEMS:  *** Constitutional: Denies fevers, chills or abnormal weight loss Eyes: Denies blurriness of vision Ears, nose, mouth, throat, and face: Denies mucositis or sore throat Respiratory: Denies cough, dyspnea or wheezes Cardiovascular: Denies palpitation, chest discomfort or lower extremity swelling Gastrointestinal:  Denies nausea, heartburn or change in bowel habits Skin: Denies abnormal skin rashes Lymphatics: Denies new lymphadenopathy or easy bruising Neurological:Denies numbness, tingling or new weaknesses Behavioral/Psych: Mood is stable, no new changes  All other systems were reviewed with the patient and are negative.  MEDICAL HISTORY:  Past Medical History:  Diagnosis Date  . Anxiety   . Asthma   . Attention deficit disorder    without hyperactivity  . Chronic low back pain   . Fatigue   . Generalized subcutaneous nodules 08/14/2011  . GERD (gastroesophageal reflux disease)   . Hemochromatosis, hereditary (HCC)   . Lyme disease    tx in [place]  . Muscle weakness   . Palpitations   . Tremor     SURGICAL HISTORY: Past Surgical History:  Procedure Laterality Date  . BREAST ENHANCEMENT SURGERY  1991  . CESAREAN SECTION  2004  . CHOLECYSTECTOMY  2011  . NASAL SINUS SURGERY  2000    I have reviewed the social history and family history with the patient  and they are unchanged from previous note.  ALLERGIES:  is allergic to compazine, sulfa antibiotics, amoxicillin, ciprofloxacin, levofloxacin, tetracyclines & related, diphenhydramine hcl, and morphine and related.  MEDICATIONS:  Current Outpatient Medications  Medication Sig Dispense Refill  . ALPRAZolam (XANAX) 0.5 MG tablet Take 0.5 mg by mouth 3 (three) times daily as needed.    . desvenlafaxine (PRISTIQ) 50 MG 24 hr tablet Take 50 mg by mouth daily.    Marland Kitchen EPINEPHrine (EPIPEN JR) 0.15 MG/0.3ML injection Inject 0.15 mg into the muscle as needed.      . ergocalciferol (VITAMIN D2) 1.25 MG (50000 UT) capsule Take 50,000 Units by mouth once a week.    Marland Kitchen HYDROmorphone (DILAUDID) 4 MG tablet Take by mouth every 4 (four) hours as needed for severe pain (and at bedtime).    Marland Kitchen levalbuterol (XOPENEX HFA) 45 MCG/ACT inhaler Inhale 2 puffs into the lungs every 4 (four) hours as needed.      . lidocaine (LIDODERM) 5 %     . phentermine 30 MG capsule      No current facility-administered medications for this visit.    PHYSICAL EXAMINATION: ECOG PERFORMANCE STATUS: {CHL ONC ECOG [phone]}  There were no vitals filed for this visit. There were no vitals filed for this visit. *** GENERAL:alert, no distress and comfortable SKIN: skin color, texture, turgor are normal, no rashes or significant lesions EYES: normal, Conjunctiva are pink and non-injected, sclera clear {OROPHARYNX:no exudate, no erythema and lips, buccal mucosa, and tongue normal}  NECK: supple, thyroid normal size, non-tender, without nodularity LYMPH:  no palpable lymphadenopathy  in the cervical, axillary {or inguinal} LUNGS: clear to auscultation and percussion with normal breathing effort HEART: regular rate & rhythm and no murmurs and no lower extremity edema ABDOMEN:abdomen soft, non-tender and normal bowel sounds Musculoskeletal:no cyanosis of digits and no clubbing  NEURO: alert & oriented x 3 with fluent speech, no  focal motor/sensory deficits  LABORATORY DATA:  I have reviewed the data as listed CBC Latest Ref Rng & Units 10/17/2019 09/27/2019 02/21/2019  WBC 4.0 - 10.5 K/uL 7.6 8.1 9.7  Hemoglobin 12.0 - 15.0 g/dL 83.1 51.7 61.6  Hematocrit 36.0 - 46.0 % 43.4 40.8 40.1  Platelets 150 - 400 K/uL 231 258 253     CMP Latest Ref Rng & Units 11/21/2018 05/23/2018 04/23/2016  Glucose 70 - 99 mg/dL 073(X) 106(Y) 89  BUN 6 - 20 mg/dL 14 11 15   Creatinine 0.44 - 1.00 mg/dL 6.94 8.54  Sodium 135 - 145 mmol/L 143 140 140  Potassium 3.5 - 5.1 mmol/L 4.3 4.3 4.7  Chloride 98 - 111 mmol/L 103 99 97  CO2 22 - 32 mmol/L 29 24 26   Calcium 8.9 - 10.3 mg/dL 6.27 9.8 9.7  Total Protein 6.5 - 8.1 g/dL 7.5 6.9 6.6  Total Bilirubin 0.3 - 1.2 mg/dL 0.4 0.2 0.4  Alkaline Phos 38 - 126 U/L 61 56 82  AST 15 - 41 U/L 17 35 13  ALT 0 - 44 U/L 19 44(H) 15      RADIOGRAPHIC STUDIES: I have personally reviewed the radiological images as listed and agreed with the findings in the report. No results found.   ASSESSMENT & PLAN:  Chelsea Alexander is a 55 y.o. female with   1. 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 Her sister and mother's side of her family have been diagnosed as well. Her children was tested and all negative.   -She consistently has ferritin levels less than 100 and has only required phlebotomy a few times   -Goal of Ferritin<50 ng/ml and transferrin saturation <50%, through Phlebotomy. Last phlebotomy in 11/2018.  -Her 41's Abdomen from 09/20/19 showed enlarged and heterogeneous liver, probable fatty liver or steatohepatitis or changes of cirrhosis/fibrosis. Will monitor with [place] every 2 years.  ***    2. Fibromyalgia  -With fatigue and muscle pain. Currently controlled, but her main pain.   3. Cancer Screenings -She has not had mammogram since 05/2013 due to fibromyalgia pain. I encouraged her to take Tylenol before screening to help with pain. She is  interested.  -She notes she has saline implants with scar tissue in which has needed to be changes occasionally. I previously referred her to plastic surgeon in [place] for her to see after COVID-19 improves. Implants should be changed every 10 years. *** -Last colonoscopy and endoscopy with Dr 06/2013 was over 7 years ago. She can f/u with him to repeat.  -She will do [place] liver periodically to monitor for liver cirrhosis.   3. Anxiety, depression  -On Prestiq. Currently well managed. Mood stable.   PLAN:  *** -Lab every 6 months X2, will schedule phlebotomy if ferritin more than 50, or saturation more than 50% -F/u in 1 year.     No problem-specific Assessment & Plan notes found for this encounter.   No orders of the defined types were placed in this encounter.  All questions were answered. The patient knows to call the clinic with any problems, questions or concerns. No barriers to learning was detected.  The total time spent in the appointment was {CHL ONC TIME VISIT - QQPYP:[id number]}.     Delphina Cahill 09/23/2020   Rogelia Rohrer, am acting as scribe for Malachy Mood, MD.   {Add scribe attestation statement}

## 2020-09-26 ENCOUNTER — Telehealth: Payer: Self-pay | Admitting: Hematology

## 2020-09-26 ENCOUNTER — Ambulatory Visit: Payer: BC Managed Care – PPO | Admitting: Hematology

## 2020-09-26 ENCOUNTER — Other Ambulatory Visit: Payer: BC Managed Care – PPO

## 2020-09-26 NOTE — Telephone Encounter (Signed)
Rescheduled today's appointment per 3/10 schedule message. Patient is aware of changes. 

## 2020-10-14 NOTE — Progress Notes (Incomplete)
[facility]   Telephone:(336) [phone] Fax:(336) [phone]   Clinic Follow up Note   Patient Care Team: Eartha Inch, MD as PCP - General (Family Medicine) Lucila Maine as Physician Assistant (Physician Assistant) Levert Feinstein, MD as Consulting Physician (Oncology)  Date of Service:  10/14/2020  CHIEF COMPLAINT: Hereditary hemochromatosis   CURRENT THERAPY:  Therapeutic Phlebotomy as needed   INTERVAL HISTORY: *** Chelsea Alexander is here for a follow up. He was last seen by me 1 year ago. She presents to the clinic alone.    REVIEW OF SYSTEMS:  *** Constitutional: Denies fevers, chills or abnormal weight loss Eyes: Denies blurriness of vision Ears, nose, mouth, throat, and face: Denies mucositis or sore throat Respiratory: Denies cough, dyspnea or wheezes Cardiovascular: Denies palpitation, chest discomfort or lower extremity swelling Gastrointestinal:  Denies nausea, heartburn or change in bowel habits Skin: Denies abnormal skin rashes Lymphatics: Denies new lymphadenopathy or easy bruising Neurological:Denies numbness, tingling or new weaknesses Behavioral/Psych: Mood is stable, no new changes  All other systems were reviewed with the patient and are negative.  MEDICAL HISTORY:  Past Medical History:  Diagnosis Date  . Anxiety   . Asthma   . Attention deficit disorder    without hyperactivity  . Chronic low back pain   . Fatigue   . Generalized subcutaneous nodules 08/14/2011  . GERD (gastroesophageal reflux disease)   . Hemochromatosis, hereditary (HCC)   . Lyme disease    tx in [place]  . Muscle weakness   . Palpitations   . Tremor     SURGICAL HISTORY: Past Surgical History:  Procedure Laterality Date  . BREAST ENHANCEMENT SURGERY  1991  . CESAREAN SECTION  2004  . CHOLECYSTECTOMY  2011  . NASAL SINUS SURGERY  2000    I have reviewed the social history and family history with the patient and they are unchanged from previous  note.  ALLERGIES:  is allergic to compazine, sulfa antibiotics, amoxicillin, ciprofloxacin, levofloxacin, tetracyclines & related, diphenhydramine hcl, and morphine and related.  MEDICATIONS:  Current Outpatient Medications  Medication Sig Dispense Refill  . ALPRAZolam (XANAX) 0.5 MG tablet Take 0.5 mg by mouth 3 (three) times daily as needed.    . desvenlafaxine (PRISTIQ) 50 MG 24 hr tablet Take 50 mg by mouth daily.    Marland Kitchen EPINEPHrine (EPIPEN JR) 0.15 MG/0.3ML injection Inject 0.15 mg into the muscle as needed.      . ergocalciferol (VITAMIN D2) 1.25 MG (50000 UT) capsule Take 50,000 Units by mouth once a week.    Marland Kitchen HYDROmorphone (DILAUDID) 4 MG tablet Take by mouth every 4 (four) hours as needed for severe pain (and at bedtime).    Marland Kitchen levalbuterol (XOPENEX HFA) 45 MCG/ACT inhaler Inhale 2 puffs into the lungs every 4 (four) hours as needed.      . lidocaine (LIDODERM) 5 %     . phentermine 30 MG capsule      No current facility-administered medications for this visit.    PHYSICAL EXAMINATION: ECOG PERFORMANCE STATUS: {CHL ONC ECOG PS:[phone]}  There were no vitals filed for this visit. There were no vitals filed for this visit. *** GENERAL:alert, no distress and comfortable SKIN: skin color, texture, turgor are normal, no rashes or significant lesions EYES: normal, Conjunctiva are pink and non-injected, sclera clear {OROPHARYNX:no exudate, no erythema and lips, buccal mucosa, and tongue normal}  NECK: supple, thyroid normal size, non-tender, without nodularity LYMPH:  no palpable lymphadenopathy in the cervical, axillary {  or inguinal} LUNGS: clear to auscultation and percussion with normal breathing effort HEART: regular rate & rhythm and no murmurs and no lower extremity edema ABDOMEN:abdomen soft, non-tender and normal bowel sounds Musculoskeletal:no cyanosis of digits and no clubbing  NEURO: alert & oriented x 3 with fluent speech, no focal motor/sensory  deficits  LABORATORY DATA:  I have reviewed the data as listed CBC Latest Ref Rng & Units 10/17/2019 09/27/2019 02/21/2019  WBC 4.0 - 10.5 K/uL 7.6 8.1 9.7  Hemoglobin 12.0 - 15.0 g/dL 36.1 44.3 15.4  Hematocrit 36.0 - 46.0 % 43.4 40.8 40.1  Platelets 150 - 400 K/uL 231 258 253     CMP Latest Ref Rng & Units 11/21/2018 05/23/2018 04/23/2016  Glucose 70 - 99 mg/dL 008(Q) 761(P) 89  BUN 6 - 20 mg/dL 14 11 15   Creatinine 0.44 - 1.00 mg/dL 5.09 3.26  Sodium 135 - 145 mmol/L 143 140 140  Potassium 3.5 - 5.1 mmol/L 4.3 4.3 4.7  Chloride 98 - 111 mmol/L 103 99 97  CO2 22 - 32 mmol/L 29 24 26   Calcium 8.9 - 10.3 mg/dL 7.12 9.8 9.7  Total Protein 6.5 - 8.1 g/dL 7.5 6.9 6.6  Total Bilirubin 0.3 - 1.2 mg/dL 0.4 0.2 0.4  Alkaline Phos 38 - 126 U/L 61 56 82  AST 15 - 41 U/L 17 35 13  ALT 0 - 44 U/L 19 44(H) 15      RADIOGRAPHIC STUDIES: I have personally reviewed the radiological images as listed and agreed with the findings in the report. No results found.   ASSESSMENT & PLAN:  Chelsea Alexander is a 55 y.o. female with    1. 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 Her sister and mother's side of her family have been diagnosed as well. Her children was tested and all negative.   -She consistently has ferritin levels less than 100 and has only required phlebotomy a few times   -Goal of Ferritin<50 ng/ml and transferrin saturation <50%, through Phlebotomy. Last phlebotomy in 11/2018.  -41's Abdomen from 09/20/19 showed enlarged and heterogeneous liver, probable fatty liver or steatohepatitis or changes of cirrhosis/fibrosis. ***   2. Fibromyalgia  -With fatigue and muscle pain. Currently controlled, but her main pain.   3. Cancer Screenings -She has not had mammogram since 05/2013 due to fibromyalgia pain.  -She notes she has saline implants with scar tissue in which has needed to be changes occasionally.  Implants should be changed every 10  years.  -Last colonoscopy and endoscopy with Dr 11/20/19 was over 7 years ago. She can f/u with him to repeat.  -She will do 06/2013 liver periodically to monitor for liver cirrhosis.   3. Anxiety, depression  -On Prestiq. Currently well managed. Mood stable.   PLAN:  ***   No problem-specific Assessment & Plan notes found for this encounter.   No orders of the defined types were placed in this encounter.  All questions were answered. The patient knows to call the clinic with any problems, questions or concerns. No barriers to learning was detected. The total time spent in the appointment was {CHL ONC TIME VISIT - Lottie Mussel.     [place] 10/14/2020   Delphina Cahill, am acting as scribe for 10/16/2020, MD.   {Add scribe attestation statement}

## 2020-10-16 NOTE — Progress Notes (Incomplete)
[facility]   Telephone:(336) [phone] Fax:(336) [phone]   Clinic Follow up Note   Patient Care Team: Eartha Inch, MD as PCP - General (Family Medicine) Lucila Maine as Physician Assistant (Physician Assistant) Levert Feinstein, MD as Consulting Physician (Oncology)  Date of Service:  10/16/2020  CHIEF COMPLAINT: Hereditary hemochromatosis   CURRENT THERAPY:  Therapeutic Phlebotomy as needed   INTERVAL HISTORY: *** Chelsea Alexander is here for a follow up. He was last seen by me 1 year ago. She presents to the clinic alone.    REVIEW OF SYSTEMS:  *** Constitutional: Denies fevers, chills or abnormal weight loss Eyes: Denies blurriness of vision Ears, nose, mouth, throat, and face: Denies mucositis or sore throat Respiratory: Denies cough, dyspnea or wheezes Cardiovascular: Denies palpitation, chest discomfort or lower extremity swelling Gastrointestinal:  Denies nausea, heartburn or change in bowel habits Skin: Denies abnormal skin rashes Lymphatics: Denies new lymphadenopathy or easy bruising Neurological:Denies numbness, tingling or new weaknesses Behavioral/Psych: Mood is stable, no new changes  All other systems were reviewed with the patient and are negative.  MEDICAL HISTORY:  Past Medical History:  Diagnosis Date  . Anxiety   . Asthma   . Attention deficit disorder    without hyperactivity  . Chronic low back pain   . Fatigue   . Generalized subcutaneous nodules 08/14/2011  . GERD (gastroesophageal reflux disease)   . Hemochromatosis, hereditary (HCC)   . Lyme disease    tx in [place]  . Muscle weakness   . Palpitations   . Tremor     SURGICAL HISTORY: Past Surgical History:  Procedure Laterality Date  . BREAST ENHANCEMENT SURGERY  1991  . CESAREAN SECTION  2004  . CHOLECYSTECTOMY  2011  . NASAL SINUS SURGERY  2000    I have reviewed the social history and family history with the patient and they are unchanged from previous  note.  ALLERGIES:  is allergic to compazine, sulfa antibiotics, amoxicillin, ciprofloxacin, levofloxacin, tetracyclines & related, diphenhydramine hcl, and morphine and related.  MEDICATIONS:  Current Outpatient Medications  Medication Sig Dispense Refill  . ALPRAZolam (XANAX) 0.5 MG tablet Take 0.5 mg by mouth 3 (three) times daily as needed.    . desvenlafaxine (PRISTIQ) 50 MG 24 hr tablet Take 50 mg by mouth daily.    Marland Kitchen EPINEPHrine (EPIPEN JR) 0.15 MG/0.3ML injection Inject 0.15 mg into the muscle as needed.      . ergocalciferol (VITAMIN D2) 1.25 MG (50000 UT) capsule Take 50,000 Units by mouth once a week.    Marland Kitchen HYDROmorphone (DILAUDID) 4 MG tablet Take by mouth every 4 (four) hours as needed for severe pain (and at bedtime).    Marland Kitchen levalbuterol (XOPENEX HFA) 45 MCG/ACT inhaler Inhale 2 puffs into the lungs every 4 (four) hours as needed.      . lidocaine (LIDODERM) 5 %     . phentermine 30 MG capsule      No current facility-administered medications for this visit.    PHYSICAL EXAMINATION: ECOG PERFORMANCE STATUS: {CHL ONC ECOG PS:[id number]}  There were no vitals filed for this visit. There were no vitals filed for this visit. *** GENERAL:alert, no distress and comfortable SKIN: skin color, texture, turgor are normal, no rashes or significant lesions EYES: normal, Conjunctiva are pink and non-injected, sclera clear {OROPHARYNX:no exudate, no erythema and lips, buccal mucosa, and tongue normal}  NECK: supple, thyroid normal size, non-tender, without nodularity LYMPH:  no palpable lymphadenopathy in the cervical, axillary {  or inguinal} LUNGS: clear to auscultation and percussion with normal breathing effort HEART: regular rate & rhythm and no murmurs and no lower extremity edema ABDOMEN:abdomen soft, non-tender and normal bowel sounds Musculoskeletal:no cyanosis of digits and no clubbing  NEURO: alert & oriented x 3 with fluent speech, no focal motor/sensory  deficits  LABORATORY DATA:  I have reviewed the data as listed CBC Latest Ref Rng & Units 10/17/2019 09/27/2019 02/21/2019  WBC 4.0 - 10.5 K/uL 7.6 8.1 9.7  Hemoglobin 12.0 - 15.0 g/dL 69.6 78.9 38.1  Hematocrit 36.0 - 46.0 % 43.4 40.8 40.1  Platelets 150 - 400 K/uL 231 258 253     CMP Latest Ref Rng & Units 11/21/2018 05/23/2018 04/23/2016  Glucose 70 - 99 mg/dL 017(P) 102(H) 89  BUN 6 - 20 mg/dL 14 11 15   Creatinine 0.44 - 1.00 mg/dL 8.52 7.78  Sodium 135 - 145 mmol/L 143 140 140  Potassium 3.5 - 5.1 mmol/L 4.3 4.3 4.7  Chloride 98 - 111 mmol/L 103 99 97  CO2 22 - 32 mmol/L 29 24 26   Calcium 8.9 - 10.3 mg/dL 2.42 9.8 9.7  Total Protein 6.5 - 8.1 g/dL 7.5 6.9 6.6  Total Bilirubin 0.3 - 1.2 mg/dL 0.4 0.2 0.4  Alkaline Phos 38 - 126 U/L 61 56 82  AST 15 - 41 U/L 17 35 13  ALT 0 - 44 U/L 19 44(H) 15      RADIOGRAPHIC STUDIES: I have personally reviewed the radiological images as listed and agreed with the findings in the report. No results found.   ASSESSMENT & PLAN:  Chelsea Alexander is a 55 y.o. female with    1. 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 Her sister and mother's side of her family have been diagnosed as well. Her children was tested and all negative.   -She consistently has ferritin levels less than 100 and has only required phlebotomy a few times   -Goal of Ferritin<50 ng/ml and transferrin saturation <50%, through Phlebotomy. Last phlebotomy in 11/2018.  -41's Abdomen from 09/20/19 showed enlarged and heterogeneous liver, probable fatty liver or steatohepatitis or changes of cirrhosis/fibrosis. ***   2. Fibromyalgia  -With fatigue and muscle pain. Currently controlled, but her main pain.   3. Cancer Screenings -She has not had mammogram since 05/2013 due to fibromyalgia pain.  -She notes she has saline implants with scar tissue in which has needed to be changes occasionally.  Implants should be changed every 10  years.  -Last colonoscopy and endoscopy with Dr 11/20/19 was over 7 years ago. She can f/u with him to repeat.  -She will do 06/2013 liver periodically to monitor for liver cirrhosis.   3. Anxiety, depression  -On Prestiq. Currently well managed. Mood stable.   PLAN:  ***   No problem-specific Assessment & Plan notes found for this encounter.   No orders of the defined types were placed in this encounter.  All questions were answered. The patient knows to call the clinic with any problems, questions or concerns. No barriers to learning was detected. The total time spent in the appointment was {CHL ONC TIME VISIT - Lottie Mussel.     [place] 10/16/2020   I, Kari Baars, am acting as scribe for 10/18/2020, MD.   {Add scribe attestation statement}

## 2020-10-17 ENCOUNTER — Other Ambulatory Visit: Payer: BC Managed Care – PPO

## 2020-10-17 ENCOUNTER — Ambulatory Visit: Payer: BC Managed Care – PPO | Admitting: Hematology

## 2020-10-17 ENCOUNTER — Telehealth: Payer: Self-pay | Admitting: Hematology

## 2020-10-17 NOTE — Telephone Encounter (Signed)
R/s appts per 3/31 sch msg. Called pt, no answer. Left msg with updated appts date and time.

## 2020-10-28 NOTE — Progress Notes (Incomplete)
[facility]   Telephone:(336) [phone] Fax:(336) [phone]   Clinic Follow up Note   Patient Care Team: Eartha Inch, MD as PCP - General (Family Medicine) Lucila Maine as Physician Assistant (Physician Assistant) Levert Feinstein, MD as Consulting Physician (Oncology)  Date of Service:  10/28/2020  CHIEF COMPLAINT: Hereditary hemochromatosis  CURRENT THERAPY:  Therapeutic Phlebotomy as needed   INTERVAL HISTORY: *** Chelsea Alexander is here for a follow up. She was last seen by me on 1 year ago. She presents to the clinic alone.    REVIEW OF SYSTEMS:  *** Constitutional: Denies fevers, chills or abnormal weight loss Eyes: Denies blurriness of vision Ears, nose, mouth, throat, and face: Denies mucositis or sore throat Respiratory: Denies cough, dyspnea or wheezes Cardiovascular: Denies palpitation, chest discomfort or lower extremity swelling Gastrointestinal:  Denies nausea, heartburn or change in bowel habits Skin: Denies abnormal skin rashes Lymphatics: Denies new lymphadenopathy or easy bruising Neurological:Denies numbness, tingling or new weaknesses Behavioral/Psych: Mood is stable, no new changes  All other systems were reviewed with the patient and are negative.  MEDICAL HISTORY:  Past Medical History:  Diagnosis Date  . Anxiety   . Asthma   . Attention deficit disorder    without hyperactivity  . Chronic low back pain   . Fatigue   . Generalized subcutaneous nodules 08/14/2011  . GERD (gastroesophageal reflux disease)   . Hemochromatosis, hereditary (HCC)   . Lyme disease    tx in [place]  . Muscle weakness   . Palpitations   . Tremor     SURGICAL HISTORY: Past Surgical History:  Procedure Laterality Date  . BREAST ENHANCEMENT SURGERY  1991  . CESAREAN SECTION  2004  . CHOLECYSTECTOMY  2011  . NASAL SINUS SURGERY  2000    I have reviewed the social history and family history with the patient and they are unchanged from  previous note.  ALLERGIES:  is allergic to compazine, sulfa antibiotics, amoxicillin, ciprofloxacin, levofloxacin, tetracyclines & related, diphenhydramine hcl, and morphine and related.  MEDICATIONS:  Current Outpatient Medications  Medication Sig Dispense Refill  . ALPRAZolam (XANAX) 0.5 MG tablet Take 0.5 mg by mouth 3 (three) times daily as needed.    . desvenlafaxine (PRISTIQ) 50 MG 24 hr tablet Take 50 mg by mouth daily.    Marland Kitchen EPINEPHrine (EPIPEN JR) 0.15 MG/0.3ML injection Inject 0.15 mg into the muscle as needed.      . ergocalciferol (VITAMIN D2) 1.25 MG (50000 UT) capsule Take 50,000 Units by mouth once a week.    Marland Kitchen HYDROmorphone (DILAUDID) 4 MG tablet Take by mouth every 4 (four) hours as needed for severe pain (and at bedtime).    Marland Kitchen levalbuterol (XOPENEX HFA) 45 MCG/ACT inhaler Inhale 2 puffs into the lungs every 4 (four) hours as needed.      . lidocaine (LIDODERM) 5 %     . phentermine 30 MG capsule      No current facility-administered medications for this visit.    PHYSICAL EXAMINATION: ECOG PERFORMANCE STATUS: {CHL ONC ECOG PS:[id number]}  There were no vitals filed for this visit. There were no vitals filed for this visit. *** GENERAL:alert, no distress and comfortable SKIN: skin color, texture, turgor are normal, no rashes or significant lesions EYES: normal, Conjunctiva are pink and non-injected, sclera clear {OROPHARYNX:no exudate, no erythema and lips, buccal mucosa, and tongue normal}  NECK: supple, thyroid normal size, non-tender, without nodularity LYMPH:  no palpable lymphadenopathy in the cervical, axillary {  or inguinal} LUNGS: clear to auscultation and percussion with normal breathing effort HEART: regular rate & rhythm and no murmurs and no lower extremity edema ABDOMEN:abdomen soft, non-tender and normal bowel sounds Musculoskeletal:no cyanosis of digits and no clubbing  NEURO: alert & oriented x 3 with fluent speech, no focal motor/sensory  deficits  LABORATORY DATA:  I have reviewed the data as listed CBC Latest Ref Rng & Units 10/17/2019 09/27/2019 02/21/2019  WBC 4.0 - 10.5 K/uL 7.6 8.1 9.7  Hemoglobin 12.0 - 15.0 g/dL 57.8 46.9 62.9  Hematocrit 36.0 - 46.0 % 43.4 40.8 40.1  Platelets 150 - 400 K/uL 231 258 253     CMP Latest Ref Rng & Units 11/21/2018 05/23/2018 04/23/2016  Glucose 70 - 99 mg/dL 528(U) 132(G) 89  BUN 6 - 20 mg/dL 14 11 15   Creatinine 0.44 - 1.00 mg/dL 4.01 0.27  Sodium 135 - 145 mmol/L 143 140 140  Potassium 3.5 - 5.1 mmol/L 4.3 4.3 4.7  Chloride 98 - 111 mmol/L 103 99 97  CO2 22 - 32 mmol/L 29 24 26   Calcium 8.9 - 10.3 mg/dL 2.53 9.8 9.7  Total Protein 6.5 - 8.1 g/dL 7.5 6.9 6.6  Total Bilirubin 0.3 - 1.2 mg/dL 0.4 0.2 0.4  Alkaline Phos 38 - 126 U/L 61 56 82  AST 15 - 41 U/L 17 35 13  ALT 0 - 44 U/L 19 44(H) 15      RADIOGRAPHIC STUDIES: I have personally reviewed the radiological images as listed and agreed with the findings in the report. No results found.   ASSESSMENT & PLAN:  Chelsea Alexander is a 55 y.o. female with   1. 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 Her sister and mother's side of her family have been diagnosed as well. Her children was tested and all negative.   -She consistently has ferritin levels less than 100 and has only required phlebotomy a few times   -Goal of Ferritin<50 ng/ml and transferrin saturation <50%, through Phlebotomy. Last phlebotomy in 11/2018.  -We discussed his 41's Abdomen from 09/20/19 which showed enlarged and heterogeneous liver, probable fatty liver or steatohepatitis or changes of cirrhosis/fibrosis. -I discussed her Fatty liver could be related to high cholesterol level and being overweight. She denies high carbs or gluten diet. She notes due to fibromyalgia she has been on prednisone repeatedly over the years. I encouraged her to work on weight loss and monitor cholesterol with PCP.  -Elastography was  recommended. If abnormal will refer her back to Dr [place]. I will also do 11/20/19 abdomen to monitor her iron deposit in liver, she is agreeable.  -Labs reviewed, CBC WNL with HCT 40.8%. Ferritin and Iron panel still pending, normal at last visit.  -Will continue with lab every 6 months, f/u in 1 year.   2. Fibromyalgia  -With fatigue and muscle pain. Currently controlled, but her main pain.   3. Cancer Screenings -She has not had mammogram since 05/2013 due to fibromyalgia pain. I encouraged her to take Tylenol before screening to help with pain. She is interested.  -She notes she has saline implants with scar tissue in which has needed to be changes occasionally. I previously referred her to plastic surgeon in [place] for her to see after COVID-19 improves. Implants should be changed every 10 years.  -Last colonoscopy and endoscopy with Dr 06/2013 was over 7 years ago. She can f/u with him to repeat.  -She will do [place] liver  periodically to monitor for liver cirrhosis.   3. Anxiety, depression  -On Prestiq. Currently well managed. Mood stable.   PLAN:  -Liver elastography in a few weeks, I will call her with results  -Lab every 6 months X2, will schedule phlebotomy if ferritin more than 50, or saturation more than 50% -F/u in 1 year.    No problem-specific Assessment & Plan notes found for this encounter.   No orders of the defined types were placed in this encounter.  All questions were answered. The patient knows to call the clinic with any problems, questions or concerns. No barriers to learning was detected. The total time spent in the appointment was {CHL ONC TIME VISIT - [id number]}.     Delphina Cahill 10/28/2020   Rogelia Rohrer, am acting as scribe for Malachy Mood, MD.   {Add scribe attestation statement}

## 2020-10-30 ENCOUNTER — Ambulatory Visit: Payer: BC Managed Care – PPO | Admitting: Hematology

## 2020-10-30 ENCOUNTER — Other Ambulatory Visit: Payer: BC Managed Care – PPO

## 2020-11-06 ENCOUNTER — Telehealth: Payer: Self-pay | Admitting: Hematology

## 2020-11-06 NOTE — Telephone Encounter (Signed)
R/s appts per 4/20 sch msg. Called pt, no answer. Left msg with appts date and times.  

## 2020-11-11 NOTE — Progress Notes (Incomplete)
[facility]   Telephone:(336) [phone] Fax:(336) [phone]   Clinic Follow up Note   Patient Care Team: Eartha Inch, MD as PCP - General (Family Medicine) Lucila Maine as Physician Assistant (Physician Assistant) Levert Feinstein, MD as Consulting Physician (Oncology)  Date of Service:  11/11/2020  CHIEF COMPLAINT: Hereditary hemochromatosis  CURRENT THERAPY:  Therapeutic Phlebotomy as needed   INTERVAL HISTORY: *** Chelsea Alexander is here for a follow up of Hereditary hemochromatosis. She was last seen by me 1 year ago. She presents to the clinic alone.    REVIEW OF SYSTEMS:  *** Constitutional: Denies fevers, chills or abnormal weight loss Eyes: Denies blurriness of vision Ears, nose, mouth, throat, and face: Denies mucositis or sore throat Respiratory: Denies cough, dyspnea or wheezes Cardiovascular: Denies palpitation, chest discomfort or lower extremity swelling Gastrointestinal:  Denies nausea, heartburn or change in bowel habits Skin: Denies abnormal skin rashes Lymphatics: Denies new lymphadenopathy or easy bruising Neurological:Denies numbness, tingling or new weaknesses Behavioral/Psych: Mood is stable, no new changes  All other systems were reviewed with the patient and are negative.  MEDICAL HISTORY:  Past Medical History:  Diagnosis Date  . Anxiety   . Asthma   . Attention deficit disorder    without hyperactivity  . Chronic low back pain   . Fatigue   . Generalized subcutaneous nodules 08/14/2011  . GERD (gastroesophageal reflux disease)   . Hemochromatosis, hereditary (HCC)   . Lyme disease    tx in [place]  . Muscle weakness   . Palpitations   . Tremor     SURGICAL HISTORY: Past Surgical History:  Procedure Laterality Date  . BREAST ENHANCEMENT SURGERY  1991  . CESAREAN SECTION  2004  . CHOLECYSTECTOMY  2011  . NASAL SINUS SURGERY  2000    I have reviewed the social history and family history with the patient and  they are unchanged from previous note.  ALLERGIES:  is allergic to compazine, sulfa antibiotics, amoxicillin, ciprofloxacin, levofloxacin, tetracyclines & related, diphenhydramine hcl, and morphine and related.  MEDICATIONS:  Current Outpatient Medications  Medication Sig Dispense Refill  . ALPRAZolam (XANAX) 0.5 MG tablet Take 0.5 mg by mouth 3 (three) times daily as needed.    . desvenlafaxine (PRISTIQ) 50 MG 24 hr tablet Take 50 mg by mouth daily.    Marland Kitchen EPINEPHrine (EPIPEN JR) 0.15 MG/0.3ML injection Inject 0.15 mg into the muscle as needed.      . ergocalciferol (VITAMIN D2) 1.25 MG (50000 UT) capsule Take 50,000 Units by mouth once a week.    Marland Kitchen HYDROmorphone (DILAUDID) 4 MG tablet Take by mouth every 4 (four) hours as needed for severe pain (and at bedtime).    Marland Kitchen levalbuterol (XOPENEX HFA) 45 MCG/ACT inhaler Inhale 2 puffs into the lungs every 4 (four) hours as needed.      . lidocaine (LIDODERM) 5 %     . phentermine 30 MG capsule      No current facility-administered medications for this visit.    PHYSICAL EXAMINATION: ECOG PERFORMANCE STATUS: {CHL ONC ECOG PS:[id number]}  There were no vitals filed for this visit. There were no vitals filed for this visit. *** GENERAL:alert, no distress and comfortable SKIN: skin color, texture, turgor are normal, no rashes or significant lesions EYES: normal, Conjunctiva are pink and non-injected, sclera clear {OROPHARYNX:no exudate, no erythema and lips, buccal mucosa, and tongue normal}  NECK: supple, thyroid normal size, non-tender, without nodularity LYMPH:  no palpable lymphadenopathy in the  cervical, axillary {or inguinal} LUNGS: clear to auscultation and percussion with normal breathing effort HEART: regular rate & rhythm and no murmurs and no lower extremity edema ABDOMEN:abdomen soft, non-tender and normal bowel sounds Musculoskeletal:no cyanosis of digits and no clubbing  NEURO: alert & oriented x 3 with fluent speech, no focal  motor/sensory deficits  LABORATORY DATA:  I have reviewed the data as listed CBC Latest Ref Rng & Units 10/17/2019 09/27/2019 02/21/2019  WBC 4.0 - 10.5 K/uL 7.6 8.1 9.7  Hemoglobin 12.0 - 15.0 g/dL 56.3 87.5 64.3  Hematocrit 36.0 - 46.0 % 43.4 40.8 40.1  Platelets 150 - 400 K/uL 231 258 253     CMP Latest Ref Rng & Units 11/21/2018 05/23/2018 04/23/2016  Glucose 70 - 99 mg/dL 329(J) 188(C) 89  BUN 6 - 20 mg/dL 14 11 15   Creatinine 0.44 - 1.00 mg/dL 1.66 0.63  Sodium 135 - 145 mmol/L 143 140 140  Potassium 3.5 - 5.1 mmol/L 4.3 4.3 4.7  Chloride 98 - 111 mmol/L 103 99 97  CO2 22 - 32 mmol/L 29 24 26   Calcium 8.9 - 10.3 mg/dL 0.16 9.8 9.7  Total Protein 6.5 - 8.1 g/dL 7.5 6.9 6.6  Total Bilirubin 0.3 - 1.2 mg/dL 0.4 0.2 0.4  Alkaline Phos 38 - 126 U/L 61 56 82  AST 15 - 41 U/L 17 35 13  ALT 0 - 44 U/L 19 44(H) 15      RADIOGRAPHIC STUDIES: I have personally reviewed the radiological images as listed and agreed with the findings in the report. No results found.   ASSESSMENT & PLAN:  Chelsea Alexander is a 55 y.o. female with   1. 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 Her sister and mother's side of her family have been diagnosed as well. Her children was tested and all negative.   -She consistently has ferritin levels less than 100 and has only required phlebotomy a few times   -Goal of Ferritin<50 ng/ml and transferrin saturation <50%, through Phlebotomy. Last phlebotomy in 11/2018.  -We discussed his 41's Abdomen from 09/20/19 which showed enlarged and heterogeneous liver, probable fatty liver or steatohepatitis or changes of cirrhosis/fibrosis. -I discussed her Fatty liver could be related to high cholesterol level and being overweight. She denies high carbs or gluten diet. She notes due to fibromyalgia she has been on prednisone repeatedly over the years. I encouraged her to work on weight loss and monitor cholesterol with PCP.   -Elastography was recommended. If abnormal will refer her back to Dr [place]. I will also do 11/20/19 abdomen to monitor her iron deposit in liver, she is agreeable.  -Labs reviewed, CBC WNL with HCT 40.8%. Ferritin and Iron panel still pending, normal at last visit.  -Will continue with lab every 6 months, f/u in 1 year.   2. Fibromyalgia  -With fatigue and muscle pain. Currently controlled, but her main pain.   3. Cancer Screenings -She has not had mammogram since 05/2013 due to fibromyalgia pain. I encouraged her to take Tylenol before screening to help with pain. She is interested.  -She notes she has saline implants with scar tissue in which has needed to be changes occasionally. I previously referred her to plastic surgeon in [place] for her to see after COVID-19 improves. Implants should be changed every 10 years.  -Last colonoscopy and endoscopy with Dr 06/2013 was over 7 years ago. She can f/u with him to repeat.  -She will do  [place] liver periodically to monitor for liver cirrhosis.   3. Anxiety, depression  -On Prestiq. Currently well managed. Mood stable.   PLAN:  -Liver elastography in a few weeks, I will call her with results  -Lab every 6 months X2, will schedule phlebotomy if ferritin more than 50, or saturation more than 50% -F/u in 1 year.    No problem-specific Assessment & Plan notes found for this encounter.   No orders of the defined types were placed in this encounter.  All questions were answered. The patient knows to call the clinic with any problems, questions or concerns. No barriers to learning was detected. The total time spent in the appointment was {CHL ONC TIME VISIT - [id number]}.     Delphina Cahill 11/11/2020   Rogelia Alexander, am acting as scribe for Malachy Mood, MD.   {Add scribe attestation statement}

## 2020-11-15 ENCOUNTER — Telehealth: Payer: Self-pay

## 2020-11-15 ENCOUNTER — Ambulatory Visit: Payer: BC Managed Care – PPO | Admitting: Hematology

## 2020-11-15 ENCOUNTER — Other Ambulatory Visit: Payer: BC Managed Care – PPO

## 2020-11-15 NOTE — Telephone Encounter (Signed)
I left message for Chelsea Alexander to call me, stating that she has missed multiple appointments and I need to speak with her regarding these.

## 2021-10-05 ENCOUNTER — Other Ambulatory Visit: Payer: Self-pay

## 2021-10-05 ENCOUNTER — Emergency Department (HOSPITAL_BASED_OUTPATIENT_CLINIC_OR_DEPARTMENT_OTHER): Payer: BC Managed Care – PPO

## 2021-10-05 ENCOUNTER — Emergency Department (HOSPITAL_BASED_OUTPATIENT_CLINIC_OR_DEPARTMENT_OTHER)
Admission: EM | Admit: 2021-10-05 | Discharge: 2021-10-05 | Disposition: A | Discharge status: Home / Self Care | Payer: BC Managed Care – PPO | Attending: Emergency Medicine | Admitting: Emergency Medicine

## 2021-10-05 DIAGNOSIS — Z79899 Other long term (current) drug therapy: Secondary | ICD-10-CM | POA: Diagnosis not present

## 2021-10-05 DIAGNOSIS — K5903 Drug induced constipation: Secondary | ICD-10-CM | POA: Insufficient documentation

## 2021-10-05 DIAGNOSIS — R1031 Right lower quadrant pain: Secondary | ICD-10-CM | POA: Diagnosis present

## 2021-10-05 LAB — CBC
HCT: 41.4 % (ref 36.0–46.0)
Hemoglobin: 13.6 g/dL (ref 12.0–15.0)
MCH: 30.8 pg (ref 26.0–34.0)
MCHC: 32.9 g/dL (ref 30.0–36.0)
MCV: 93.7 fL (ref 80.0–100.0)
Platelets: 228 10*3/uL (ref 150–400)
RBC: 4.42 MIL/uL (ref 3.87–5.11)
RDW: 12.1 % (ref 11.5–15.5)
WBC: 6.5 10*3/uL (ref 4.0–10.5)
nRBC: 0 % (ref 0.0–0.2)

## 2021-10-05 LAB — URINALYSIS, ROUTINE W REFLEX MICROSCOPIC
Bilirubin Urine: NEGATIVE
Glucose, UA: NEGATIVE mg/dL
Hgb urine dipstick: NEGATIVE
Ketones, ur: NEGATIVE mg/dL
Leukocytes,Ua: NEGATIVE
Nitrite: NEGATIVE
Specific Gravity, Urine: 1.037 — ABNORMAL HIGH (ref 1.005–1.030)
pH: 5.5 (ref 5.0–8.0)

## 2021-10-05 LAB — COMPREHENSIVE METABOLIC PANEL
ALT: 11 U/L (ref 0–44)
AST: 14 U/L — ABNORMAL LOW (ref 15–41)
Albumin: 4.4 g/dL (ref 3.5–5.0)
Alkaline Phosphatase: 67 U/L (ref 38–126)
Anion gap: 8 (ref 5–15)
BUN: 23 mg/dL — ABNORMAL HIGH (ref 6–20)
CO2: 27 mmol/L (ref 22–32)
Calcium: 10.3 mg/dL (ref 8.9–10.3)
Chloride: 102 mmol/L (ref 98–111)
Creatinine, Ser: 0.64 mg/dL (ref 0.44–1.00)
GFR, Estimated: >60 mL/min (ref >60)
Glucose, Bld: 137 mg/dL — ABNORMAL HIGH (ref 70–99)
Potassium: 4.2 mmol/L (ref 3.5–5.1)
Sodium: 137 mmol/L (ref 135–145)
Total Bilirubin: 0.4 mg/dL (ref 0.3–1.2)
Total Protein: 7.4 g/dL (ref 6.5–8.1)

## 2021-10-05 LAB — LIPASE, BLOOD: Lipase: 11 U/L (ref 11–51)

## 2021-10-05 MED ORDER — MORPHINE SULFATE (PF) 4 MG/ML IV SOLN
4.0000 mg | Freq: Once | INTRAVENOUS | Status: DC
  Filled 2021-10-05: qty 1

## 2021-10-05 MED ORDER — POLYETHYLENE GLYCOL 3350 17 GM/SCOOP PO POWD: 17.0000 g | Freq: Every day | ORAL | 0 refills | Status: AC

## 2021-10-05 MED ORDER — IOHEXOL 300 MG/ML  SOLN
100.0000 mL | Freq: Once | INTRAMUSCULAR | Status: AC | PRN
  Administered 2021-10-05: 100 mL via INTRAVENOUS

## 2021-10-05 MED ORDER — ONDANSETRON HCL 4 MG/2ML IJ SOLN
4.0000 mg | Freq: Once | INTRAMUSCULAR | Status: DC
  Filled 2021-10-05: qty 2

## 2021-10-05 MED ORDER — HYDROMORPHONE HCL 1 MG/ML IJ SOLN: 1.0000 mg | Freq: Once | INTRAMUSCULAR | Status: DC

## 2021-10-05 NOTE — ED Notes (Signed)
Patient transported to CT 

## 2021-10-05 NOTE — Discharge Instructions (Addendum)
You have been evaluated for your lower abdominal pain.  Fortunately no evidence of appendicitis.  Your labs are reassuring.  There are moderate amount of stool in your colon which may suggest constipation.  Take MiraLAX daily as it will help decrease risk of constipation.  Follow-up with your doctor for further care. ?

## 2021-10-05 NOTE — ED Notes (Signed)
Dc instructions reviewed with patient. Patient voiced understanding. Dc with belongings.  °

## 2021-10-05 NOTE — ED Provider Notes (Signed)
?[facility] ?Provider Note ? ? ?CSN: [id number] ?Arrival date & time: 10/05/21  1500 ? ?  ? ?History ? ?Chief Complaint  ?Patient presents with  ?? Abdominal Pain  ? ? ?Chelsea Alexander is a 56 y.o. female. ? ?The history is provided by the patient and medical records. No language interpreter was used.  ?Abdominal Pain ? ?56 year old female significant history of GERD, anxiety, recurrent abdominal pain, who presents complaining of abdominal pain.  Patient reports for the past 5 days she has had progressive worsening pain to her right lower quadrant.  Pain is sharp throbbing initially waxing waning but now more intense.  She noticed pain when she sits up straight sometimes with positional change.  She overall feels unwell.  She does Amao nausea without vomiting.  No fever, dysuria, hematuria, vaginal bleeding, vaginal discharge, diarrhea or constipation.  She tries taking over-the-counter medication at home along with her normal pain medication without relief.  She reports concerns for appendicitis as her children has had similar symptoms and has had an appendicitis in the past. ? ?Home Medications ?Prior to Admission medications   ?Medication Sig Start Date End Date Taking? Authorizing Provider  ?ALPRAZolam (XANAX) 0.5 MG tablet Take 0.5 mg by mouth 3 (three) times daily as needed. 09/07/19   [provider]  ?desvenlafaxine (PRISTIQ) 50 MG 24 hr tablet Take 50 mg by mouth daily.    [provider]  ?EPINEPHrine (EPIPEN JR) 0.15 MG/0.3ML injection Inject 0.15 mg into the muscle as needed.      [provider]  ?ergocalciferol (VITAMIN D2) 1.25 MG (50000 UT) capsule Take 50,000 Units by mouth once a week.    [provider]  ?HYDROmorphone (DILAUDID) 4 MG tablet Take by mouth every 4 (four) hours as needed for severe pain (and at bedtime).    [provider]  ?levalbuterol Penne Lash HFA) 45 MCG/ACT inhaler Inhale 2 puffs into the lungs every 4 (four)  hours as needed.      [provider]  ?lidocaine (LIDODERM) 5 %  08/31/19   [provider]  ?phentermine 30 MG capsule  08/11/19   [provider]  ?   ? ?Allergies    ?Compazine, Sulfa antibiotics, Amoxicillin, Ciprofloxacin, Levofloxacin, Tetracyclines & related, Diphenhydramine hcl, and Morphine and related   ? ?Review of Systems   ?Review of Systems  ?Gastrointestinal:  Positive for abdominal pain.  ?All other systems reviewed and are negative. ? ?Physical Exam ?Updated Vital Signs ?BP (!) 120/95   Pulse 88   Temp 98 ?F (36.7 ?C) (Oral)   Resp 16   Ht 5\' 1"  (1.549 m)   Wt 72.6 kg   SpO2 99%   BMI 30.22 kg/m?  ?Physical Exam ?Vitals and nursing note reviewed.  ?Constitutional:   ?   General: She is not in acute distress. ?   Appearance: She is well-developed.  ?HENT:  ?   Head: Atraumatic.  ?Eyes:  ?   Conjunctiva/sclera: Conjunctivae normal.  ?Cardiovascular:  ?   Rate and Rhythm: Normal rate and regular rhythm.  ?Pulmonary:  ?   Effort: Pulmonary effort is normal.  ?Abdominal:  ?   General: Abdomen is flat.  ?   Palpations: Abdomen is soft.  ?   Tenderness: There is abdominal tenderness in the right lower quadrant. There is no guarding or rebound. Negative signs include Murphy's sign, Rovsing's sign and McBurney's sign.  ?Musculoskeletal:  ?   Cervical back: Neck supple.  ?Skin: ?  Findings: No rash.  ?Neurological:  ?   Mental Status: She is alert.  ?Psychiatric:     ?   Mood and Affect: Mood normal.  ? ? ?ED Results / Procedures / Treatments   ?Labs ?(all labs ordered are listed, but only abnormal results are displayed) ?Labs Reviewed  ?COMPREHENSIVE METABOLIC PANEL - Abnormal; Notable for the following components:  ?    Result Value  ? Glucose, Bld 137 (*)   ? BUN 23 (*)   ? AST 14 (*)   ? All other components within normal limits  ?URINALYSIS, ROUTINE W REFLEX MICROSCOPIC - Abnormal; Notable for the following components:  ? Specific Gravity, Urine 1.037 (*)   ? Protein, ur  TRACE (*)   ? All other components within normal limits  ?LIPASE, BLOOD  ?CBC  ? ? ?EKG ?None ? ?Radiology ?CT ABDOMEN PELVIS W CONTRAST ? ?Result Date: 10/05/2021 ?CLINICAL DATA:  Right lower quadrant abdominal pain. EXAM: CT ABDOMEN AND PELVIS WITH CONTRAST TECHNIQUE: Multidetector CT imaging of the abdomen and pelvis was performed using the standard protocol following bolus administration of intravenous contrast. RADIATION DOSE REDUCTION: This exam was performed according to the departmental dose-optimization program which includes automated exposure control, adjustment of the mA and/or kV according to patient size and/or use of iterative reconstruction technique. CONTRAST:  127mL OMNIPAQUE IOHEXOL 300 MG/ML  SOLN COMPARISON:  09/08/2010 FINDINGS: Lower chest: Subsegmental atelectasis noted right middle lobe. Hepatobiliary: No suspicious focal abnormality within the liver parenchyma. Small area of low attenuation in the anterior liver, adjacent to the falciform ligament, is in a characteristic location for focal fatty deposition. Gallbladder is surgically absent. Mild intrahepatic biliary duct prominence associated with common bile duct measuring up to 10 mm diameter. Pancreas: No focal mass lesion. No dilatation of the main duct. No intraparenchymal cyst. No peripancreatic edema. Spleen: No splenomegaly. No focal mass lesion. Adrenals/Urinary Tract: No adrenal nodule or mass. Kidneys unremarkable. No evidence for hydroureter. The urinary bladder appears normal for the degree of distention. Stomach/Bowel: Small hiatal hernia. Stomach otherwise unremarkable. Duodenum is normally positioned as is the ligament of Treitz. No small bowel wall thickening. No small bowel dilatation. The terminal ileum is normal. Cecal tip is up anterior to the right hepatic lobe. The appendix is normal. No gross colonic mass. No colonic wall thickening. Moderate stool volume throughout. Vascular/Lymphatic: There is mild atherosclerotic  calcification of the abdominal aorta without aneurysm. There is no gastrohepatic or hepatoduodenal ligament lymphadenopathy. No retroperitoneal or mesenteric lymphadenopathy. No pelvic sidewall lymphadenopathy. Reproductive: The uterus is unremarkable.  There is no adnexal mass. Other: No intraperitoneal free fluid. Musculoskeletal: No worrisome lytic or sclerotic osseous abnormality. IMPRESSION: 1. No acute findings in the abdomen or pelvis. Specifically, no findings to explain the patient's history of right lower quadrant pain. Terminal ileum and appendix are normal. No adnexal mass. 2. Mild extrahepatic biliary duct dilatation. This may be related to the patient's prior cholecystectomy. Correlation with liver function test recommended. 3. Moderate moderate stool volume. Imaging features could be compatible with clinical constipation. 4. Small hiatal hernia. 5. Aortic Atherosclerosis (ICD10-I70.0). Electronically Signed   By: Misty Stanley M.D.   On: 10/05/2021 17:17   ? ?Procedures ?Procedures  ? ? ?Medications Ordered in ED ?Medications  ?ondansetron (ZOFRAN) injection 4 mg (has no administration in time range)  ?HYDROmorphone (DILAUDID) injection 1 mg (1 mg Intravenous Not Given 10/05/21 1722)  ?iohexol (OMNIPAQUE) 300 MG/ML solution 100 mL (100 mLs Intravenous Contrast Given 10/05/21 1642)  ? ? ?  ED Course/ Medical Decision Making/ A&P ?  ?                        ?Medical Decision Making ?Amount and/or Complexity of Data Reviewed ?Labs: ordered. ?Radiology: ordered. ? ?Risk ?Prescription drug management. ? ? ?BP (!) 120/95   Pulse 88   Temp 98 ?F (36.7 ?C) (Oral)   Resp 16   Ht 5\' 1"  (1.549 m)   Wt 72.6 kg   SpO2 99%   BMI 30.22 kg/m?  ? ?4:26 PM ?This is a 56 year old female presenting with complaints of right lower quadrant abdominal pain for nearly a week.  She does have some tenderness on exam.  Labs obtained and independently reviewed by me.  Labs are reassuring, normal WBC, normal lipase, normal  electrolyte panel.  Urinalysis obtained without signs of UTI.  However due to her recurrent and worsening abdominal discomfort, an abdominal and pelvis CT scan have been ordered.  We will give patient medication to help

## 2021-10-05 NOTE — ED Triage Notes (Signed)
Patient arrives with complaints of RLQ x5 days. Patient reports some nausea and suspects she may have appendicitis.  ? ?Reports that both of her children had to have their appendixes removed in the past.  ?

## 2021-12-21 ENCOUNTER — Emergency Department (HOSPITAL_BASED_OUTPATIENT_CLINIC_OR_DEPARTMENT_OTHER)
Admission: EM | Admit: 2021-12-21 | Discharge: 2021-12-22 | Disposition: A | Discharge status: Home / Self Care | Payer: BC Managed Care – PPO | Attending: Emergency Medicine | Admitting: Emergency Medicine

## 2021-12-21 ENCOUNTER — Other Ambulatory Visit: Payer: Self-pay

## 2021-12-21 ENCOUNTER — Encounter (HOSPITAL_BASED_OUTPATIENT_CLINIC_OR_DEPARTMENT_OTHER): Payer: Self-pay

## 2021-12-21 DIAGNOSIS — J45909 Unspecified asthma, uncomplicated: Secondary | ICD-10-CM | POA: Insufficient documentation

## 2021-12-21 DIAGNOSIS — F419 Anxiety disorder, unspecified: Secondary | ICD-10-CM | POA: Diagnosis present

## 2021-12-21 LAB — URINALYSIS, ROUTINE W REFLEX MICROSCOPIC
Bilirubin Urine: NEGATIVE
Glucose, UA: NEGATIVE mg/dL
Hgb urine dipstick: NEGATIVE
Leukocytes,Ua: NEGATIVE
Nitrite: NEGATIVE
Protein, ur: NEGATIVE mg/dL
Specific Gravity, Urine: 1.01 (ref 1.005–1.030)
pH: 5.5 (ref 5.0–8.0)

## 2021-12-21 MED ORDER — HYDROXYZINE HCL 25 MG PO TABS
25.0000 mg | ORAL_TABLET | Freq: Once | ORAL | Status: AC
  Administered 2021-12-21: 25 mg via ORAL
  Filled 2021-12-21: qty 1

## 2021-12-21 NOTE — ED Triage Notes (Signed)
Pt states she has anxiety and takes xanax and states, "it's not helping" has been increasing the last 3 days.  Xanax 0.5mg  TID Last dose @ 3pm Son brought her here and will return

## 2021-12-22 MED ORDER — HYDROXYZINE HCL 25 MG PO TABS: 25.0000 mg | ORAL_TABLET | Freq: Four times a day (QID) | ORAL | 0 refills | Status: AC | PRN

## 2021-12-22 NOTE — Discharge Instructions (Addendum)
You were seen today for worsening anxiety symptoms.  You need to follow-up with your primary physician and seek out counseling services and/or psychiatry.  You are already on a benzodiazepine and an SSRI.  If you develop symptoms of wanting to hurt yourself or anyone else, you need to be reevaluated immediately.

## 2021-12-22 NOTE — ED Provider Notes (Signed)
[facility] EMERGENCY DEPT Provider Note   CSN: [id number] Arrival date & time: 12/21/21  1850     History  Chief Complaint  Patient presents with   Anxiety    Chelsea Alexander is a 56 y.o. female.  HPI     This is a 56 year old female who presents with worsening anxiety.  She has a history of anxiety and she takes Xanax and Pristiq.  She states over the last several days she has had worsening panic anxiety.  She recently divorced and is now "going through all the financial burden of that divorce."  She states she feels like she may be coming to a breaking point.  She denies SI or HI.  She does not see a counselor or a psychiatrist.  She states that her panic manifests in heavy breathing and crying.  Home Medications Prior to Admission medications   Medication Sig Start Date End Date Taking? Authorizing Provider  hydrOXYzine (ATARAX) 25 MG tablet Take 1 tablet (25 mg total) by mouth every 6 (six) hours as needed for anxiety. 12/22/21  Yes Nahun Kronberg, Mayer Masker, MD  ALPRAZolam Prudy Feeler) 0.5 MG tablet Take 0.5 mg by mouth 3 (three) times daily as needed. 09/07/19   [provider]  desvenlafaxine (PRISTIQ) 50 MG 24 hr tablet Take 50 mg by mouth daily.    [provider]  EPINEPHrine (EPIPEN JR) 0.15 MG/0.3ML injection Inject 0.15 mg into the muscle as needed.      [provider]  ergocalciferol (VITAMIN D2) 1.25 MG (50000 UT) capsule Take 50,000 Units by mouth once a week.    [provider]  HYDROmorphone (DILAUDID) 4 MG tablet Take by mouth every 4 (four) hours as needed for severe pain (and at bedtime).    [provider]  levalbuterol Pauline Aus HFA) 45 MCG/ACT inhaler Inhale 2 puffs into the lungs every 4 (four) hours as needed.      [provider]  lidocaine (LIDODERM) 5 %  08/31/19   [provider]  phentermine 30 MG capsule  08/11/19   [provider]  polyethylene glycol powder (GLYCOLAX/MIRALAX) 17  GM/SCOOP powder Take 17 g by mouth daily. 10/05/21   Fayrene Helper, PA-C      Allergies    Compazine, Sulfa antibiotics, Amoxicillin, Ciprofloxacin, Levofloxacin, Tetracyclines & related, Diphenhydramine hcl, and Morphine and related    Review of Systems   Review of Systems  Psychiatric/Behavioral:  Negative for suicidal ideas. The patient is nervous/anxious.   All other systems reviewed and are negative.  Physical Exam Updated Vital Signs BP 125/86   Pulse 79   Temp 98.3 F (36.8 C) (Oral)   Resp 12   Ht 1.549 m (5\' 1" )   Wt 70.3 kg   SpO2 100%   BMI 29.29 kg/m  Physical Exam Vitals and nursing note reviewed.  Constitutional:      Appearance: She is well-developed. She is obese.     Comments: Tearful, labile  HENT:     Head: Normocephalic and atraumatic.  Eyes:     Pupils: Pupils are equal, round, and reactive to light.  Cardiovascular:     Rate and Rhythm: Normal rate and regular rhythm.     Heart sounds: Normal heart sounds.  Pulmonary:     Effort: Pulmonary effort is normal. No respiratory distress.     Breath sounds: No wheezing.  Abdominal:     Palpations: Abdomen is soft.  Musculoskeletal:     Cervical back: Neck supple.  Skin:  General: Skin is warm and dry.  Neurological:     Mental Status: She is alert and oriented to person, place, and time.  Psychiatric:     Comments: Anxious mood    ED Results / Procedures / Treatments   Labs (all labs ordered are listed, but only abnormal results are displayed) Labs Reviewed  URINALYSIS, ROUTINE W REFLEX MICROSCOPIC - Abnormal; Notable for the following components:      Result Value   Color, Urine COLORLESS (*)    Ketones, ur TRACE (*)    All other components within normal limits    EKG None  Radiology No results found.  Procedures Procedures    Medications Ordered in ED Medications  hydrOXYzine (ATARAX) tablet 25 mg (25 mg Oral Given 12/21/21 2325)    ED Course/ Medical Decision Making/ A&P                            Medical Decision Making Amount and/or Complexity of Data Reviewed Labs: ordered.  Risk Prescription drug management.   This patient presents to the ED for concern of anxiety, this involves an extensive number of treatment options, and is a complaint that carries with it a high risk of complications and morbidity.  I considered the following differential and admission for this acute, potentially life threatening condition.  The differential diagnosis includes anxiety, panic attack, generalized anxiety  MDM:    Patient presents with worsening anxiety.  She is nontoxic and vital signs are reassuring.  She is tearful and labile.  She has increased stressors at home.  She is already on Xanax and Pristiq.  Patient was given hydroxyzine.  EKG without acute ischemic changes.  Her vital signs and pulmonary exam are normal.  Doubt organic pathology.  She denies SI or HI.  She does not appear to have acute psychiatric emergency.  She did have some improvement with hydroxyzine.  We will add this to her regimen but have encouraged her to seek out counseling services and psychiatry services for adjustments of meds and other collateral coping mechanisms.  (Labs, imaging, consults)  Labs: I Ordered, and personally interpreted labs.  The pertinent results include: None  Imaging Studies ordered: I ordered imaging studies including none I independently visualized and interpreted imaging. I agree with the radiologist interpretation  Additional history obtained from chart review.  External records from outside source obtained and reviewed including prior evaluation  Cardiac Monitoring: The patient was maintained on a cardiac monitor.  I personally viewed and interpreted the cardiac monitored which showed an underlying rhythm of: Sinus rhythm  Reevaluation: After the interventions noted above, I reevaluated the patient and found that they have :improved  Social Determinants of  Health: divorced   Disposition:  d/c  Co morbidities that complicate the patient evaluation  Past Medical History:  Diagnosis Date   Anxiety    Asthma    Attention deficit disorder    without hyperactivity   Chronic low back pain    Fatigue    Generalized subcutaneous nodules 08/14/2011   GERD (gastroesophageal reflux disease)    Hemochromatosis, hereditary (HCC)    Lyme disease    tx in [place]   Muscle weakness    Palpitations    Tremor      Medicines Meds ordered this encounter  Medications   hydrOXYzine (ATARAX) tablet 25 mg   hydrOXYzine (ATARAX) 25 MG tablet    Sig: Take 1 tablet (25 mg total) by  mouth every 6 (six) hours as needed for anxiety.    Dispense:  20 tablet    Refill:  0    I have reviewed the patients home medicines and have made adjustments as needed  Problem List / ED Course: Problem List Items Addressed This Visit       Other   Anxiety - Primary   Relevant Medications   hydrOXYzine (ATARAX) 25 MG tablet                Final Clinical Impression(s) / ED Diagnoses Final diagnoses:  Anxiety    Rx / DC Orders ED Discharge Orders          Ordered    hydrOXYzine (ATARAX) 25 MG tablet  Every 6 hours PRN        12/22/21 0045              Shon BatonHorton, Asjia Berrios F, MD 12/22/21 [phone]

## 2021-12-22 NOTE — ED Notes (Signed)
Pt verbalizes understanding of discharge instructions. Opportunity for questioning and answers were provided. Pt discharged from ED to home with son.    

## 2022-06-04 IMAGING — CT CT ABD-PELV W/ CM
2 of 5 series · 16 of 46 positions shown, 18 images · IV contrast (APPLIED)
Comparison: 09/08/2010

CLINICAL DATA: Right lower quadrant abdominal pain.

EXAM:
CT ABDOMEN AND PELVIS WITH CONTRAST
TECHNIQUE: Multidetector CT imaging of the abdomen and pelvis was performed
using the standard protocol following bolus administration of
intravenous contrast.

[Series 2: abd pel w · axial · 0.78mm/px · z∈[+583,+943]mm · 13 of 82 slices shown, 15 images]
[im 5/82  soft-tissue]
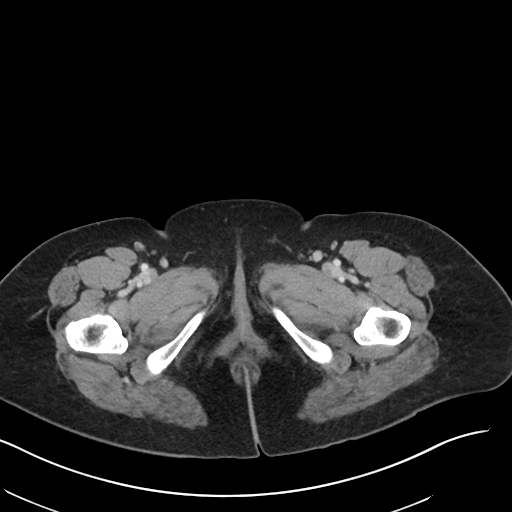
[im 5/82  bone]
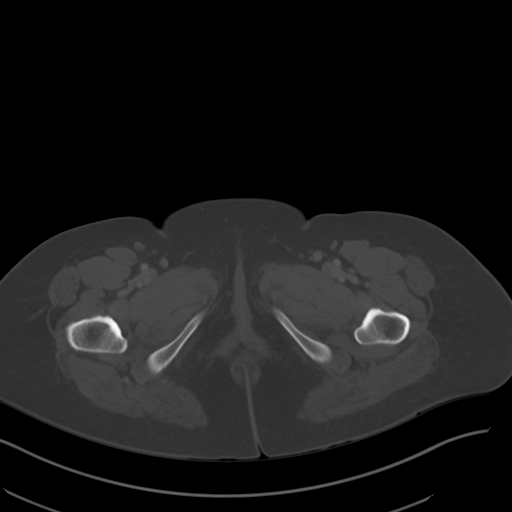
[im 10/82  soft-tissue]
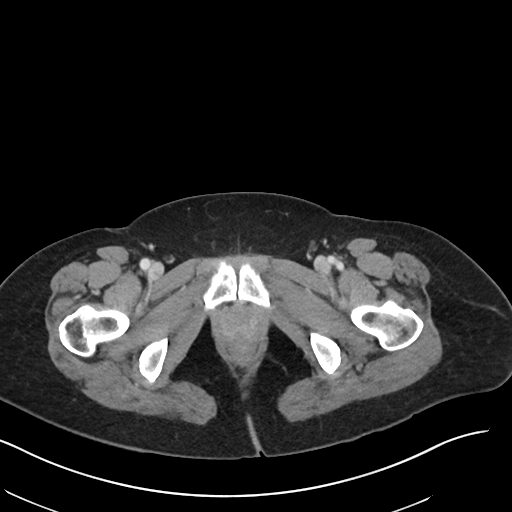
[im 20/82  soft-tissue]
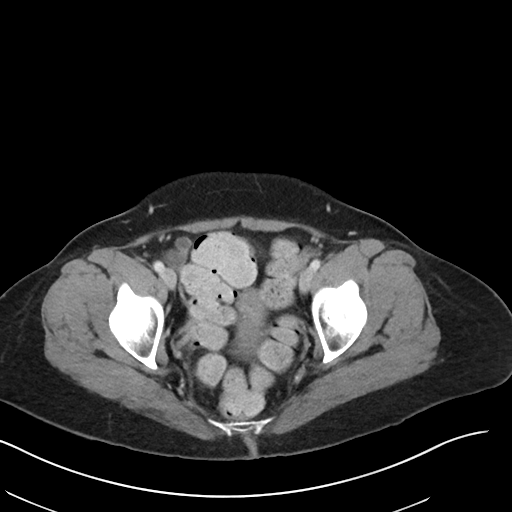
[im 24/82  soft-tissue]
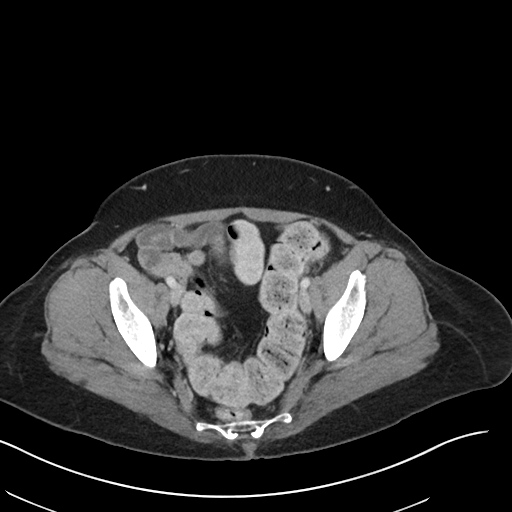
[im 29/82  soft-tissue]
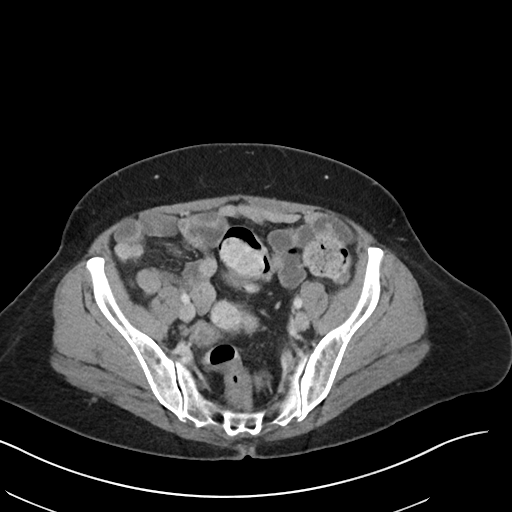
[im 34/82  soft-tissue]
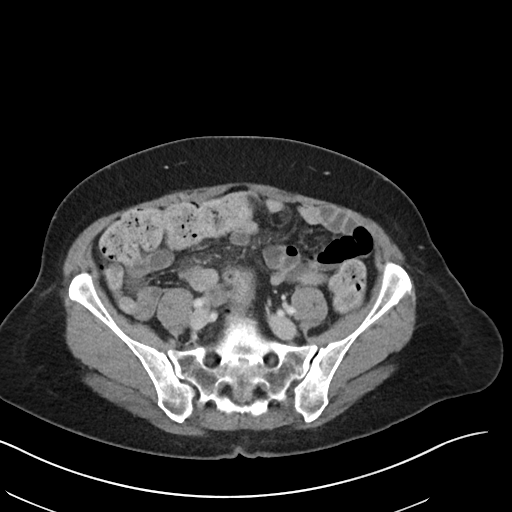
[im 43/82  soft-tissue]
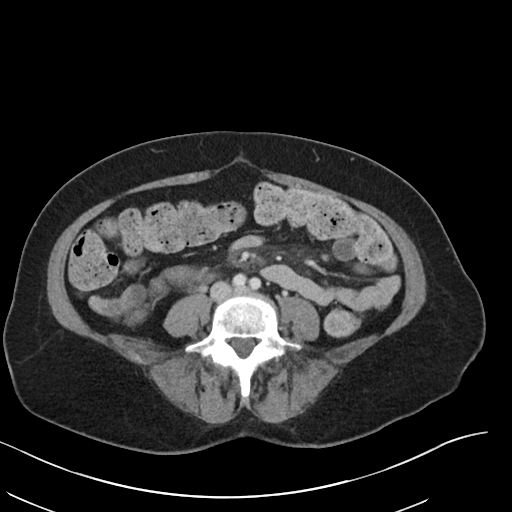
[im 48/82  soft-tissue]
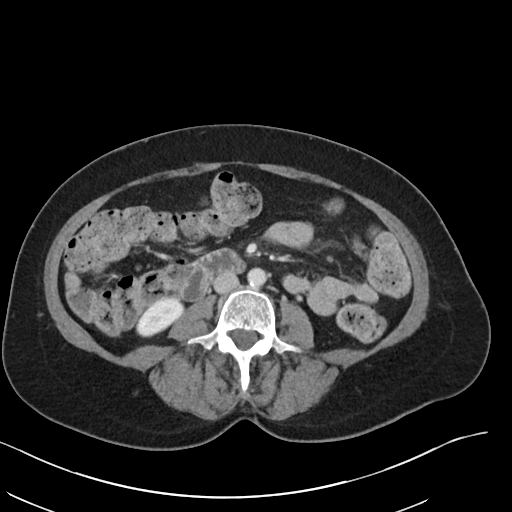
[im 53/82  soft-tissue]
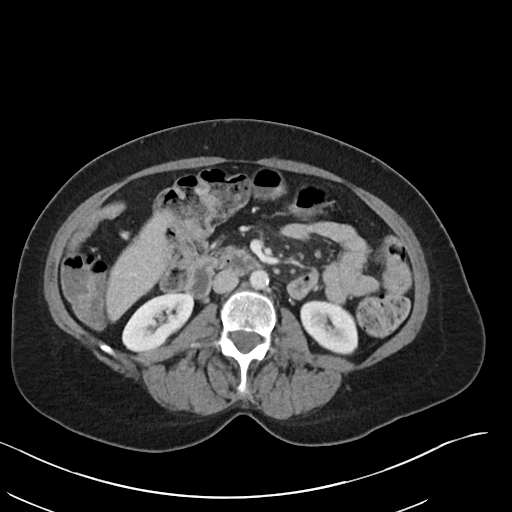
[im 53/82  bone]
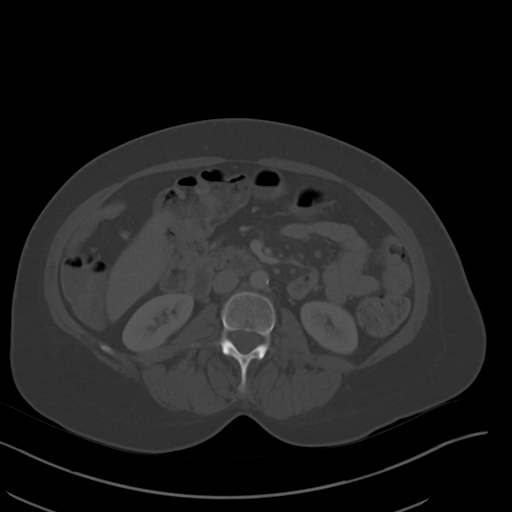
[im 58/82  soft-tissue]
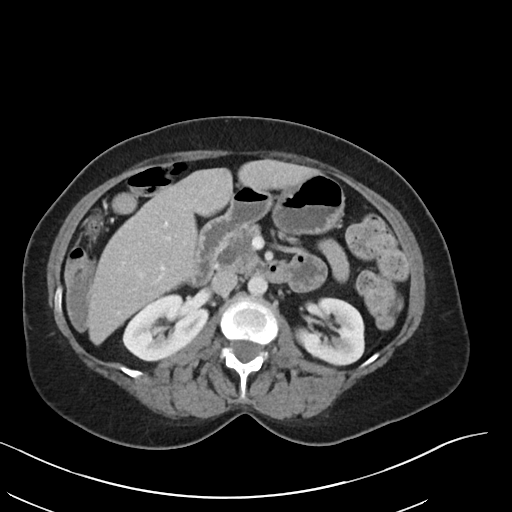
[im 62/82  soft-tissue]
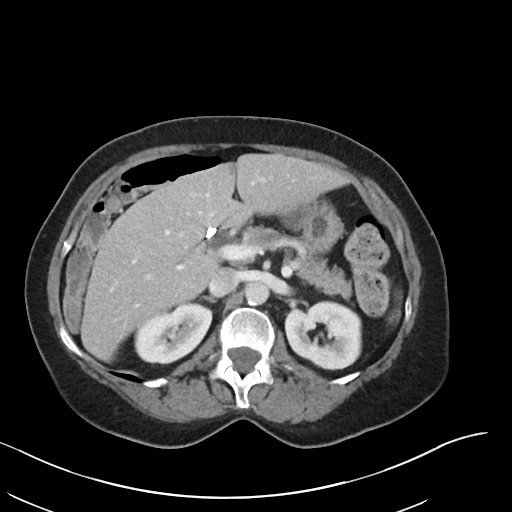
[im 72/82  soft-tissue]
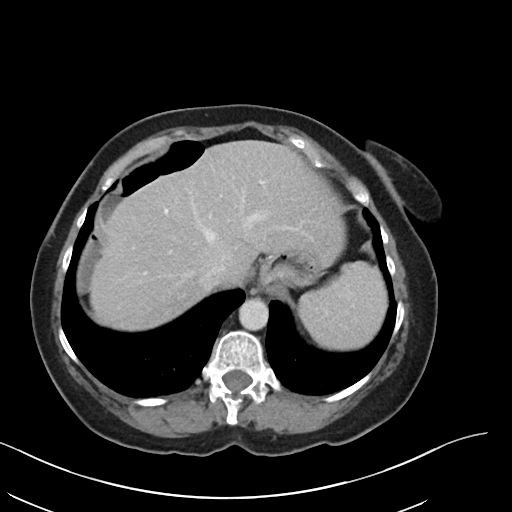
[im 77/82  soft-tissue]
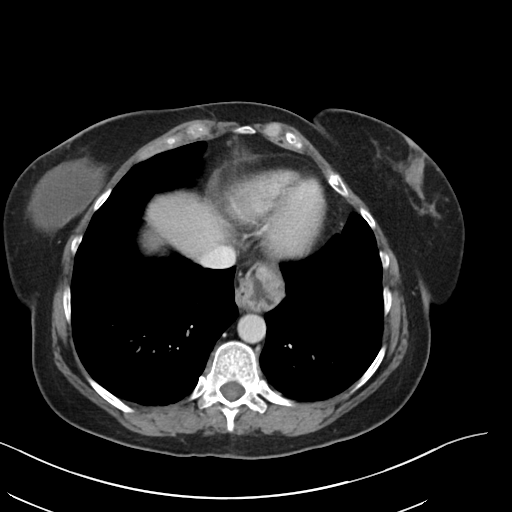

[Series 5: coronal · coronal · 0.79mm/px · 3 of 95 slices shown]
[im 32/95  soft-tissue]
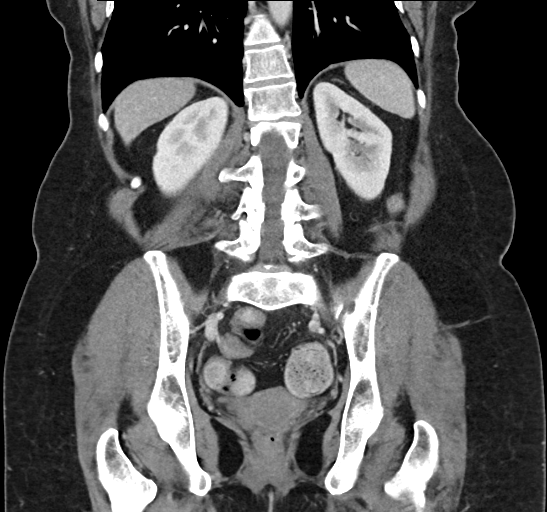
[im 42/95  soft-tissue]
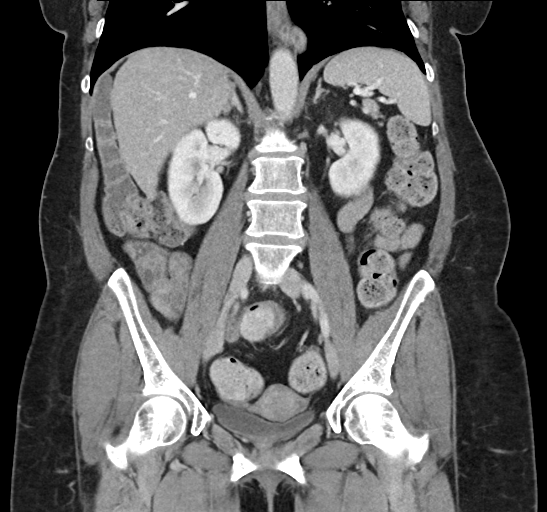
[im 53/95  soft-tissue]
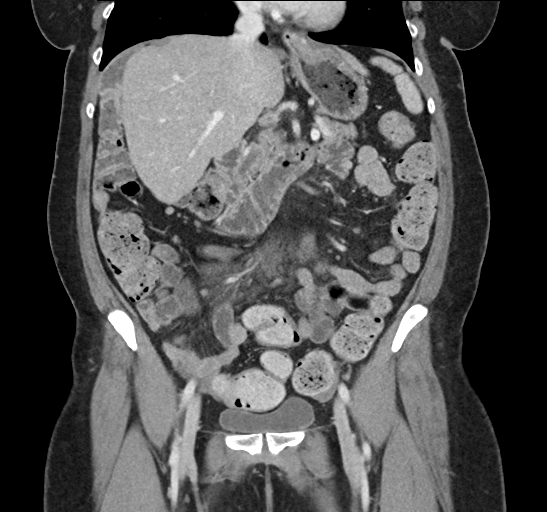

[16 of 46 positions shown; findings below may reference images not displayed]

RADIATION DOSE REDUCTION: This exam was performed according to the
departmental dose-optimization program which includes automated
exposure control, adjustment of the mA and/or kV according to
patient size and/or use of iterative reconstruction technique.

CONTRAST:  100mL OMNIPAQUE IOHEXOL 300 MG/ML  SOLN
FINDINGS: Lower chest: Subsegmental atelectasis noted right middle lobe.

Hepatobiliary: No suspicious focal abnormality within the liver
parenchyma. Small area of low attenuation in the anterior liver,
adjacent to the falciform ligament, is in a characteristic location
for focal fatty deposition. Gallbladder is surgically absent. Mild
intrahepatic biliary duct prominence associated with common bile
duct measuring up to 10 mm diameter.

Pancreas: No focal mass lesion. No dilatation of the main duct. No
intraparenchymal cyst. No peripancreatic edema.

Spleen: No splenomegaly. No focal mass lesion.

Adrenals/Urinary Tract: No adrenal nodule or mass. Kidneys
unremarkable. No evidence for hydroureter. The urinary bladder
appears normal for the degree of distention.

Stomach/Bowel: Small hiatal hernia. Stomach otherwise unremarkable.
Duodenum is normally positioned as is the ligament of Treitz. No
small bowel wall thickening. No small bowel dilatation. The terminal
ileum is normal. Cecal tip is up anterior to the right hepatic lobe.
The appendix is normal. No gross colonic mass. No colonic wall
thickening. Moderate stool volume throughout.

Vascular/Lymphatic: There is mild atherosclerotic calcification of
the abdominal aorta without aneurysm. There is no gastrohepatic or
hepatoduodenal ligament lymphadenopathy. No retroperitoneal or
mesenteric lymphadenopathy. No pelvic sidewall lymphadenopathy.

Reproductive: The uterus is unremarkable.  There is no adnexal mass.

Other: No intraperitoneal free fluid.

Musculoskeletal: No worrisome lytic or sclerotic osseous
abnormality.
IMPRESSION: 1. No acute findings in the abdomen or pelvis. Specifically, no
findings to explain the patient's history of right lower quadrant
pain. Terminal ileum and appendix are normal. No adnexal mass.
2. Mild extrahepatic biliary duct dilatation. This may be related to
the patient's prior cholecystectomy. Correlation with liver function
test recommended.
3. Moderate moderate stool volume. Imaging features could be
compatible with clinical constipation.
4. Small hiatal hernia.
5. Aortic Atherosclerosis ([id number]).

## 2023-10-21 ENCOUNTER — Emergency Department (HOSPITAL_BASED_OUTPATIENT_CLINIC_OR_DEPARTMENT_OTHER)
Admission: EM | Admit: 2023-10-21 | Discharge: 2023-10-21 | Disposition: A | Discharge status: Home / Self Care | Attending: Emergency Medicine | Admitting: Emergency Medicine

## 2023-10-21 ENCOUNTER — Encounter: Payer: Self-pay | Admitting: Hematology

## 2023-10-21 ENCOUNTER — Other Ambulatory Visit: Payer: Self-pay

## 2023-10-21 ENCOUNTER — Emergency Department (HOSPITAL_BASED_OUTPATIENT_CLINIC_OR_DEPARTMENT_OTHER): Admitting: Radiology

## 2023-10-21 DIAGNOSIS — M25562 Pain in left knee: Secondary | ICD-10-CM | POA: Diagnosis present

## 2023-10-21 DIAGNOSIS — J45909 Unspecified asthma, uncomplicated: Secondary | ICD-10-CM | POA: Insufficient documentation

## 2023-10-21 MED ORDER — OXYCODONE HCL 5 MG PO TABS
5.0000 mg | ORAL_TABLET | Freq: Once | ORAL | Status: AC
  Administered 2023-10-21: 5 mg via ORAL
  Filled 2023-10-21: qty 1

## 2023-10-21 MED ORDER — OXYCODONE HCL 5 MG PO TABS: 5.0000 mg | ORAL_TABLET | ORAL | 0 refills | Status: AC | PRN

## 2023-10-21 NOTE — ED Provider Notes (Signed)
 Patient here with left knee pain.  Shared PA visit.  Heard a pop in her left knee yesterday while bending over.  Differential diagnosis likely hamstring strain meniscal tear ligament injury to the left knee.  Is no major swelling or erythema.  Shin x-ray done that showed no obvious fracture or dislocation.  No effusion.  She appears to have intact quad tendon and patellar tendon on exam she is able to flex and extend the knee although minimally but with discomfort.  I do not palpate any obvious defect in these areas.  I do not appreciate any obvious laxity of the joint.  She got good pulses on exam.  Have no concern for blood clot or arterial process.  Overall we will do Ace wrap Tylenol ibuprofen rest crutches Roxicodone for breakthrough pain and have her follow-up with orthopedics.  I do suspect soft tissue injury likely in the near of the hamstring.  Discharged in good condition.  Understands return precautions.  This chart was dictated using voice recognition software.  Despite best efforts to proofread,  errors can occur which can change the documentation meaning.    Virgina Norfolk, DO 10/21/23 1738

## 2023-10-21 NOTE — Discharge Instructions (Addendum)
 You were evaluated in the emergency room for left knee pain.  Your x-rays did not show any acute abnormality.  A prescription for oxycodone was sent to your pharmacy.  Please use this as breakthrough pain.  You may alternate Tylenol with ibuprofen as well.  You are provided a referral to see orthopedic.  Please call and make an appointment within the next week.

## 2023-10-21 NOTE — ED Triage Notes (Signed)
 C/o left knee pain since last night. States was trying to stand up from plugging something in and heard a "pop" and has not been able to apply pressure to leg since.

## 2023-10-21 NOTE — ED Provider Notes (Signed)
 [place] EMERGENCY DEPARTMENT AT [facility] Provider Note   CSN: [id number] Arrival date & time: 10/21/23  1235     History  Chief Complaint  Patient presents with   Knee Pain    Chelsea Alexander is a 58 y.o. female with noncontributory past medical history presents with complaints of left knee pain.  Patient states that last night she was bending down and then stood up and felt a painful pop in her left knee.  Since then she has been unable to ambulate or bear any weight on her right lower extremity.  Denies any numbness or tingling to the extremity.  No prior surgeries or injuries to the extremity.   Knee Pain     Past Medical History:  Diagnosis Date   Anxiety    Asthma    Attention deficit disorder    without hyperactivity   Chronic low back pain    Fatigue    Generalized subcutaneous nodules 08/14/2011   GERD (gastroesophageal reflux disease)    Hemochromatosis, hereditary (HCC)    Lyme disease    tx in [place]   Muscle weakness    Palpitations    Tremor      Home Medications Prior to Admission medications   Medication Sig Start Date End Date Taking? Authorizing Provider  ALPRAZolam Prudy Feeler) 0.5 MG tablet Take 0.5 mg by mouth 3 (three) times daily as needed. 09/07/19   [provider]  desvenlafaxine (PRISTIQ) 50 MG 24 hr tablet Take 50 mg by mouth daily.    [provider]  EPINEPHrine (EPIPEN JR) 0.15 MG/0.3ML injection Inject 0.15 mg into the muscle as needed.      [provider]  ergocalciferol (VITAMIN D2) 1.25 MG (50000 UT) capsule Take 50,000 Units by mouth once a week.    [provider]  HYDROmorphone (DILAUDID) 4 MG tablet Take by mouth every 4 (four) hours as needed for severe pain (and at bedtime).    [provider]  hydrOXYzine (ATARAX) 25 MG tablet Take 1 tablet (25 mg total) by mouth every 6 (six) hours as needed for anxiety. 12/22/21   Horton, Mayer Masker, MD  levalbuterol [facility] HFA) 45 MCG/ACT inhaler  Inhale 2 puffs into the lungs every 4 (four) hours as needed.      [provider]  lidocaine (LIDODERM) 5 %  08/31/19   [provider]  phentermine 30 MG capsule  08/11/19   [provider]  polyethylene glycol powder (GLYCOLAX/MIRALAX) 17 GM/SCOOP powder Take 17 g by mouth daily. 10/05/21   Fayrene Helper, PA-C      Allergies    Compazine, Sulfa antibiotics, Amoxicillin, Ciprofloxacin, Levofloxacin, Tetracyclines & related, Diphenhydramine hcl, and Morphine and codeine    Review of Systems   Review of Systems  Musculoskeletal:  Positive for myalgias.    Physical Exam Updated Vital Signs BP 127/71   Pulse 88   Temp 98.7 F (37.1 C) (Oral)   Resp 20   Ht 5\' 1"  (1.549 m)   Wt 72.6 kg   SpO2 100%   BMI 30.23 kg/m  Physical Exam Vitals and nursing note reviewed.  Constitutional:      General: She is not in acute distress.    Appearance: She is well-developed.  HENT:     Head: Normocephalic and atraumatic.  Eyes:     Conjunctiva/sclera: Conjunctivae normal.  Cardiovascular:     Rate and Rhythm: Normal rate and regular rhythm.     Heart sounds: No murmur heard. Pulmonary:  Effort: Pulmonary effort is normal. No respiratory distress.     Breath sounds: Normal breath sounds.  Abdominal:     Palpations: Abdomen is soft.     Tenderness: There is no abdominal tenderness.  Musculoskeletal:     Cervical back: Neck supple.     Comments: Mild swelling involving the left knee joint, marked tenderness to lateral joint line, anterior joint line and hamstring insertion. No gross deformities appreciated. Capable of initiating extension and flexion with discomfort. She tolerates full range of motion of knee with some discomfort.  Compartments are soft.  Negative Homans. Stable to varus and valgus.   Skin:    General: Skin is warm and dry.     Capillary Refill: Capillary refill takes less than 2 seconds.  Neurological:     Mental Status: She is alert.   Psychiatric:        Mood and Affect: Mood normal.     ED Results / Procedures / Treatments   Labs (all labs ordered are listed, but only abnormal results are displayed) Labs Reviewed - No data to display  EKG None  Radiology DG Knee Complete 4 Views Left Result Date: 10/21/2023 CLINICAL DATA:  Left knee injury with pain. EXAM: LEFT KNEE - COMPLETE 4+ VIEW COMPARISON:  None Available. FINDINGS: No evidence of fracture, dislocation, or significant joint effusion. No evidence of arthropathy or other focal bone abnormality. Soft tissues are unremarkable. IMPRESSION: No acute osseous abnormality. Electronically Signed   By: Hart Robinsons M.D.   On: 10/21/2023 14:48    Procedures Procedures    Medications Ordered in ED Medications  oxyCODONE (Oxy IR/ROXICODONE) immediate release tablet 5 mg (has no administration in time range)    ED Course/ Medical Decision Making/ A&P                                 Medical Decision Making Amount and/or Complexity of Data Reviewed Radiology: ordered.   This patient presents to the ED with chief complaint(s) of knee pain .  The complaint involves an extensive differential diagnosis and also carries with it a high risk of complications and morbidity.   pertinent past medical history as listed in HPI  The differential diagnosis includes  Septic joint, gout, fracture, dislocation, soft tissue injury The initial plan is to  Plain films of knee Additional history obtained: Records reviewed Care Everywhere/External Records  Initial Assessment:   Patient presents with left knee pain following injury yesterday from bending to standing with associated painful pop.  On exam patient has multiple locations of localized tenderness including anterior knee joint line, lateral joint line and lateral hamstring insertion.  She tolerates full passive range of motion of knee, but is unable to bear any weight.  Compartments are soft.  Pulses symmetric. No  suspicion for DVT or vascular injury. X-rays without any acute osseous abnormality.  Independent ECG interpretation:  none  Independent labs interpretation:  The following labs were independently interpreted:  none  Independent visualization and interpretation of imaging: I independently visualized the following imaging with scope of interpretation limited to determining acute life threatening conditions related to emergency care: Knee xray, which revealed no acute osseous abnormality  Treatment and Reassessment: Patient given oxy 5 following first assessment  Consultations obtained:   none  Disposition:   Patient will be placed in an ace wrap and provided crutches, discharged home.  Provided Ortho follow-up.  The patient has been  appropriately medically screened and/or stabilized in the ED. I have low suspicion for any other emergent medical condition which would require further screening, evaluation or treatment in the ED or require inpatient management. At time of discharge the patient is hemodynamically stable and in no acute distress. I have discussed work-up results and diagnosis with patient and answered all questions. Patient is agreeable with discharge plan. We discussed strict return precautions for returning to the emergency department and they verbalized understanding.     Social Determinants of Health:   none  This note was dictated with voice recognition software.  Despite best efforts at proofreading, errors may have occurred which can change the documentation meaning.          Final Clinical Impression(s) / ED Diagnoses Final diagnoses:  Acute pain of left knee    Rx / DC Orders ED Discharge Orders     None         Halford Decamp, PA-C 10/21/23 1741    Virgina Norfolk, DO 10/21/23 1828
